# Patient Record
Sex: Female | Born: 1935 | Race: White | Hispanic: No | Smoking: Former smoker
Health system: Southern US, Community
[De-identification: ages and names within clinical notes are randomized; demographics above are authoritative.]

## PROBLEM LIST (undated history)

## (undated) HISTORY — PX: ABDOMINAL HYSTERECTOMY: SHX81

---

## 2009-02-08 ENCOUNTER — Emergency Department (HOSPITAL_COMMUNITY): Admission: EM | Admit: 2009-02-08 | Discharge: 2009-02-08 | Payer: Self-pay | Admitting: Emergency Medicine

## 2010-08-27 LAB — URINALYSIS, ROUTINE W REFLEX MICROSCOPIC
Bilirubin Urine: NEGATIVE
Ketones, ur: NEGATIVE mg/dL
Nitrite: NEGATIVE
Urobilinogen, UA: 0.2 mg/dL (ref 0.0–1.0)

## 2010-08-27 LAB — CBC
HCT: 38.1 % (ref 36.0–46.0)
Hemoglobin: 13 g/dL (ref 12.0–15.0)
MCV: 91.7 fL (ref 78.0–100.0)
Platelets: 269 10*3/uL (ref 150–400)
RDW: 13.5 % (ref 11.5–15.5)
WBC: 8 10*3/uL (ref 4.0–10.5)

## 2010-08-27 LAB — BASIC METABOLIC PANEL
BUN: 13 mg/dL (ref 6–23)
Chloride: 108 mEq/L (ref 96–112)
GFR calc non Af Amer: 44 mL/min — ABNORMAL LOW (ref >60)
Glucose, Bld: 113 mg/dL — ABNORMAL HIGH (ref 70–99)
Potassium: 3.8 mEq/L (ref 3.5–5.1)
Sodium: 142 mEq/L (ref 135–145)

## 2013-10-02 ENCOUNTER — Encounter (HOSPITAL_COMMUNITY): Payer: Self-pay | Admitting: Emergency Medicine

## 2013-10-02 ENCOUNTER — Emergency Department (HOSPITAL_COMMUNITY)
Admission: EM | Admit: 2013-10-02 | Discharge: 2013-10-02 | Disposition: A | Discharge status: Home / Self Care | Payer: Medicare HMO | Attending: Emergency Medicine | Admitting: Emergency Medicine

## 2013-10-02 DIAGNOSIS — I10 Essential (primary) hypertension: Secondary | ICD-10-CM

## 2013-10-02 DIAGNOSIS — Z87891 Personal history of nicotine dependence: Secondary | ICD-10-CM | POA: Insufficient documentation

## 2013-10-02 DIAGNOSIS — N3000 Acute cystitis without hematuria: Secondary | ICD-10-CM | POA: Insufficient documentation

## 2013-10-02 DIAGNOSIS — Z90710 Acquired absence of both cervix and uterus: Secondary | ICD-10-CM | POA: Insufficient documentation

## 2013-10-02 DIAGNOSIS — Z79899 Other long term (current) drug therapy: Secondary | ICD-10-CM | POA: Insufficient documentation

## 2013-10-02 DIAGNOSIS — Z792 Long term (current) use of antibiotics: Secondary | ICD-10-CM | POA: Insufficient documentation

## 2013-10-02 LAB — URINALYSIS, ROUTINE W REFLEX MICROSCOPIC
GLUCOSE, UA: 100 mg/dL — AB
Ketones, ur: NEGATIVE mg/dL
Nitrite: POSITIVE — AB
PROTEIN: 100 mg/dL — AB
Specific Gravity, Urine: 1.01 (ref 1.005–1.030)
UROBILINOGEN UA: 0.2 mg/dL (ref 0.0–1.0)
pH: 6.5 (ref 5.0–8.0)

## 2013-10-02 LAB — URINE MICROSCOPIC-ADD ON

## 2013-10-02 MED ORDER — PHENAZOPYRIDINE HCL 200 MG PO TABS: 200.0000 mg | ORAL_TABLET | Freq: Three times a day (TID) | ORAL | Status: DC

## 2013-10-02 MED ORDER — CEPHALEXIN 500 MG PO CAPS: 500.0000 mg | ORAL_CAPSULE | Freq: Four times a day (QID) | ORAL | Status: DC

## 2013-10-02 MED ORDER — CEPHALEXIN 500 MG PO CAPS
500.0000 mg | ORAL_CAPSULE | Freq: Once | ORAL | Status: AC
  Administered 2013-10-02: 500 mg via ORAL
  Filled 2013-10-02: qty 1

## 2013-10-02 MED ORDER — PHENAZOPYRIDINE HCL 100 MG PO TABS
200.0000 mg | ORAL_TABLET | Freq: Once | ORAL | Status: AC
  Administered 2013-10-02: 200 mg via ORAL
  Filled 2013-10-02: qty 2

## 2013-10-02 NOTE — ED Provider Notes (Signed)
CSN: [id number]     Arrival date & time 10/02/13  1853 History   First MD Initiated Contact with Patient 10/02/13 2038     Chief Complaint  Patient presents with  . Hematuria     (Consider location/radiation/quality/duration/timing/severity/associated sxs/prior Treatment) Patient is a 78 y.o. female presenting with hematuria.  Hematuria Pertinent negatives include no abdominal pain, arthralgias, chest pain, chills, congestion, fever, headaches, joint swelling, nausea, neck pain, numbness, rash, sore throat or weakness.     Donivan Scullmma F Morace is a 78 y.o. female presenting with mild dysuria with increased urinary frequency and hematuria which started around lunchtime today.  She reports having occasional episodes of UTIs, most recently about ago.  She denies fevers or chills has had no nausea or vomiting and denies abdominal pain and low back pain.  She does have suprapubic pressure sensation only which is mild.  She has taken no medications prior to arrival.    History reviewed. No pertinent past medical history. Past Surgical History  Procedure Laterality Date  . Abdominal hysterectomy     History reviewed. No pertinent family history. History  Substance Use Topics  . Smoking status: Former Games developermoker  . Smokeless tobacco: Not on file  . Alcohol Use: No   OB History   Grav Para Term Preterm Abortions TAB SAB Ect Mult Living                 Review of Systems  Constitutional: Negative for fever and chills.  HENT: Negative for congestion and sore throat.   Eyes: Negative.   Respiratory: Negative for chest tightness and shortness of breath.   Cardiovascular: Negative for chest pain.  Gastrointestinal: Negative for nausea and abdominal pain.  Genitourinary: Positive for dysuria and hematuria. Negative for vaginal bleeding and pelvic pain.  Musculoskeletal: Negative for arthralgias, joint swelling and neck pain.  Skin: Negative.  Negative for rash and wound.  Neurological: Negative  for dizziness, weakness, light-headedness, numbness and headaches.  Psychiatric/Behavioral: Negative.       Allergies  Review of patient's allergies indicates no known allergies.  Home Medications   Prior to Admission medications   Medication Sig Start Date End Date Taking? Authorizing Provider  cephALEXin (KEFLEX) 500 MG capsule Take 1 capsule (500 mg total) by mouth 4 (four) times daily. 10/02/13   Burgess AmorJulie Khyan Oats, PA-C  phenazopyridine (PYRIDIUM) 200 MG tablet Take 1 tablet (200 mg total) by mouth 3 (three) times daily. 10/02/13   Burgess AmorJulie Mystique Bjelland, PA-C   BP 170/92  Pulse 109  Temp(Src) 98 F (36.7 C) (Oral)  Resp 18  Ht 5\' 4"  (1.626 m)  Wt 134 lb 5 oz (60.924 kg)  BMI 23.04 kg/m2  SpO2 93% Physical Exam  Nursing note and vitals reviewed. Constitutional: She appears well-developed and well-nourished.  Hypertension  HENT:  Head: Normocephalic and atraumatic.  Eyes: Conjunctivae are normal.  Neck: Normal range of motion.  Cardiovascular: Normal rate, regular rhythm, normal heart sounds and intact distal pulses.   Pulmonary/Chest: Effort normal and breath sounds normal. She has no wheezes.  Abdominal: Soft. Bowel sounds are normal. There is no tenderness. There is no guarding and no CVA tenderness.  Musculoskeletal: Normal range of motion. She exhibits no tenderness.  Neurological: She is alert.  Skin: Skin is warm and dry.  Psychiatric: She has a normal mood and affect.    ED Course  Procedures (including critical care time) Labs Review Labs Reviewed  URINALYSIS, ROUTINE W REFLEX MICROSCOPIC - Abnormal; Notable for the following:  Color, Urine RED (*)    APPearance CLOUDY (*)    Glucose, UA 100 (*)    Hgb urine dipstick LARGE (*)    Bilirubin Urine SMALL (*)    Protein, ur 100 (*)    Nitrite POSITIVE (*)    Leukocytes, UA LARGE (*)    All other components within normal limits  URINE MICROSCOPIC-ADD ON - Abnormal; Notable for the following:    Squamous Epithelial / LPF  MANY (*)    Bacteria, UA MANY (*)    All other components within normal limits  URINE CULTURE    Imaging Review No results found.   EKG Interpretation None      MDM   Final diagnoses:  Cystitis, acute    Patient's labs were reviewed prior to discharge home.  She was also seen by Dr. Manus Gunningancour prior to discharge home.  She was prescribed Keflex) BM.  Also given information about hypertension and strongly encouraged followup with a primary doctor.  She states she is not seen a doctor in many years and doesn't want one.  She she was however encouraged to followup with someone for further evaluation of hypertension.  Also encouraged recheck for any worsened symptoms including fevers, vomiting, worse pain which can be sign of resistant or worsening infection.  Urine culture has been ordered.      Burgess AmorJulie Shateka Petrea, PA-C 10/02/13 2127

## 2013-10-02 NOTE — ED Notes (Signed)
Pt states she has been urinating blood since lunch time today. Denies any other pain.

## 2013-10-02 NOTE — Discharge Instructions (Signed)
Hypertension As your heart beats, it forces blood through your arteries. This force is your blood pressure. If the pressure is too high, it is called hypertension (HTN) or high blood pressure. HTN is dangerous because you may have it and not know it. High blood pressure may mean that your heart has to work harder to pump blood. Your arteries may be narrow or stiff. The extra work puts you at risk for heart disease, stroke, and other problems.  Blood pressure consists of two numbers, a higher number over a lower, 110/72, for example. It is stated as "110 over 72." The ideal is below 120 for the top number (systolic) and under 80 for the bottom (diastolic). Write down your blood pressure today. You should pay close attention to your blood pressure if you have certain conditions such as:  Heart failure.  Prior heart attack.  Diabetes  Chronic kidney disease.  Prior stroke.  Multiple risk factors for heart disease. To see if you have HTN, your blood pressure should be measured while you are seated with your arm held at the level of the heart. It should be measured at least twice. A one-time elevated blood pressure reading (especially in the Emergency Department) does not mean that you need treatment. There may be conditions in which the blood pressure is different between your right and left arms. It is important to see your caregiver soon for a recheck. Most people have essential hypertension which means that there is not a specific cause. This type of high blood pressure may be lowered by changing lifestyle factors such as:  Stress.  Smoking.  Lack of exercise.  Excessive weight.  Drug/tobacco/alcohol use.  Eating less salt. Most people do not have symptoms from high blood pressure until it has caused damage to the body. Effective treatment can often prevent, delay or reduce that damage. TREATMENT  When a cause has been identified, treatment for high blood pressure is directed at the  cause. There are a large number of medications to treat HTN. These fall into several categories, and your caregiver will help you select the medicines that are best for you. Medications may have side effects. You should review side effects with your caregiver. If your blood pressure stays high after you have made lifestyle changes or started on medicines,   Your medication(s) may need to be changed.  Other problems may need to be addressed.  Be certain you understand your prescriptions, and know how and when to take your medicine.  Be sure to follow up with your caregiver within the time frame advised (usually within two weeks) to have your blood pressure rechecked and to review your medications.  If you are taking more than one medicine to lower your blood pressure, make sure you know how and at what times they should be taken. Taking two medicines at the same time can result in blood pressure that is too low. SEEK IMMEDIATE MEDICAL CARE IF:  You develop a severe headache, blurred or changing vision, or confusion.  You have unusual weakness or numbness, or a faint feeling.  You have severe chest or abdominal pain, vomiting, or breathing problems. MAKE SURE YOU:   Understand these instructions.  Will watch your condition.  Will get help right away if you are not doing well or get worse. Document Released: 05/09/2005 Document Revised: 08/01/2011 Document Reviewed: 12/28/2007 [facility] Patient Information 2014 [place], [place]. Urinary Tract Infection A urinary tract infection (UTI) can occur any place along the urinary tract.  The tract includes the kidneys, ureters, bladder, and urethra. A type of germ called bacteria often causes a UTI. UTIs are often helped with antibiotic medicine.  HOME CARE   If given, take antibiotics as told by your doctor. Finish them even if you start to feel better.  Drink enough fluids to keep your pee (urine) clear or pale yellow.  Avoid tea, drinks with  caffeine, and bubbly (carbonated) drinks.  Pee often. Avoid holding your pee in for a long time.  Pee before and after having sex (intercourse).  Wipe from front to back after you poop (bowel movement) if you are a woman. Use each tissue only once. GET HELP RIGHT AWAY IF:   You have back pain.  You have lower belly (abdominal) pain.  You have chills.  You feel sick to your stomach (nauseous).  You throw up (vomit).  Your burning or discomfort with peeing does not go away.  You have a fever.  Your symptoms are not better in 3 days. MAKE SURE YOU:   Understand these instructions.  Will watch your condition.  Will get help right away if you are not doing well or get worse. Document Released: 10/26/2007 Document Revised: 02/01/2012 Document Reviewed: 12/08/2011 [facility] Patient Information 2014 [place], [place].   Emergency Department Resource Guide 1) Find a Doctor and Pay Out of Pocket Although you won't have to find out who is covered by your insurance plan, it is a good idea to ask around and get recommendations. You will then need to call the office and see if the doctor you have chosen will accept you as a new patient and what types of options they offer for patients who are self-pay. Some doctors offer discounts or will set up payment plans for their patients who do not have insurance, but you will need to ask so you aren't surprised when you get to your appointment.  2) Contact Your Local Health Department Not all health departments have doctors that can see patients for sick visits, but many do, so it is worth a call to see if yours does. If you don't know where your local health department is, you can check in your phone book. The CDC also has a tool to help you locate your state's health department, and many state websites also have listings of all of their local health departments.  3) Find a Walk-in Clinic If your illness is not likely to be very severe or  complicated, you may want to try a walk in clinic. These are popping up all over the country in pharmacies, drugstores, and shopping centers. They're usually staffed by nurse practitioners or physician assistants that have been trained to treat common illnesses and complaints. They're usually fairly quick and inexpensive. However, if you have serious medical issues or chronic medical problems, these are probably not your best option.  No Primary Care Doctor: - Call Health Connect at  [phone] - they can help you locate a primary care doctor that  accepts your insurance, provides certain services, etc. - Physician Referral Service- [phone]  Chronic Pain Problems: Organization         Address  Phone   Notes  [facility]  [phone] Patients need to be referred by their primary care doctor.   Medication Assistance: Organization         Address  Phone   Notes  [facility] Medication [facility] [address] [place] [place], [place] [address] [phone] --  Must be a resident of [place] [facility] [place] -- Must have NO insurance coverage whatsoever (no Medicaid/ Medicare, etc.) -- The pt. MUST have a primary care doctor that directs their care regularly and follows them in the community   MedAssist  [phone]) [phone]   [employer]  [phone]) [phone]    Agencies that provide inexpensive medical care: Organization         Address  Phone   Notes  [facility]  [phone]) [phone]   [facility]    [phone]   [facility] [address] [place], [place] [id number] [phone]) [phone]   [facility] [address], [place] [phone]) [phone]   [facility]anned Parenthood    [phone]) [phone]   [facility]    [phone]   [facility]  [address], [place] Phone:  [phone], Fax:  [phone] Hours of Operation:  9 am - 6 pm, M-F.  Also accepts  Medicaid/Medicare and self-pay.  [facility]  [address], [place] Phone: [phone]) [phone], Fax: [phone]) [phone]. Hours of Operation:  8:30 am - 5:30 pm, M-F.  Also accepts Medicaid and self-pay.  [facility] [place] [address], [place] Phone: [phone]) [phone]   [facility] [address], [place], [place] [phone], Ext. [phone] Mondays & Thursdays: 7-9 AM.  First 15 patients are seen on a first come, first serve basis.    Medicaid-accepting [facility] [place] Providers:  Organization         Address  Phone   Notes  [facility] [address], [place] [phone]) [phone] Also accepts self-pay patients.  [facility] [address] [place], [place]  [phone]   [facility] [address], [place] [phone]) [phone]   [facility] [address] [place] [address] [place] [address], [place] [phone]) [phone]   Renaye RakersVeita Bland [address], [place]   [phone] Only accepts [place] Access [place] patients after they have their name applied to their card.   Self-Pay (no insurance) in [facility] [place]:  Organization         Address  Phone   Notes  Sickle Cell Patients, [facility] [address], [place] [phone]) [phone]   [facility] [address], [place] [phone]) [phone]   [facility] [place]  [address], [place] [phone]   [facility]/Dr. Osei-Bonsu  [address] [place] [address], [place] or [phone], [place] [phone] Phone number for both [place] and [place] locations is the same.  [facility] [address], [place] [phone]) [phone]   [facility] [address] [place] [address], [place] or [address] [phone]) [phone]   [facility] [address], [place] [phone](336)  [phone], phone; [phone]) [phone], fax Sees patients 1st and 3rd Saturday of every month.  Must not qualify for public or private insurance (i.e. Medicaid, Medicare, [place] Health Choice, Veterans' Benefits)  Household income should be no more than 200% of the poverty level The clinic cannot treat you if you are pregnant or think you are pregnant  Sexually transmitted diseases are not treated at the clinic.    Dental Care: Organization  Address  Phone  Notes  °[place] [facility] [address], [place] [phone] Accepts children up to age 21 who are enrolled in Medicaid or [place] Health Choice; pregnant women with a Medicaid card; and children who have applied for Medicaid or [place] Health Choice, but were declined, whose parents can pay a reduced fee at time of service.  °[place] [facility] [place]  [address], [place] [phone] Accepts children up to age 21 who are enrolled in Medicaid or [place] Health Choice; pregnant women with a Medicaid card; and children who have applied for Medicaid or [place] Health Choice, but were declined, whose parents can pay a reduced fee at time of service.  °[facility] ° [address], [place] [phone] Patients are seen by appointment only. Walk-ins are not accepted. [facility] will see patients 18 years of age and older. °Monday - Tuesday (8am-5pm) °Most Wednesdays (8:30-5pm) °$30 per visit, cash only  °[facility] ° [address], [place] [phone] Patients are seen by appointment only. Walk-ins are not accepted. [facility] will see patients 18 years of age and older. °One Wednesday Evening (Monthly: Volunteer Based).  $30 per visit, cash only  °[facility]  [phone] for adults; Children under age 4, call Graduate Pediatric Dentistry at [phone]. Children aged  4-14, please call [phone] to request a pediatric application. ° Dental services are provided in all areas of dental care including fillings, crowns and bridges, complete and partial dentures, implants, gum treatment, root canals, and extractions. Preventive care is also provided. Treatment is provided to both adults and children. °Patients are selected via a lottery and there is often a waiting list. °  °[facility] [address], °[place] ° [phone] [email or web address] °  °[facility] [address], [place], [place] [phone], Ext. [phone] Second and Fourth Thursday of each month, opens at 6:30 AM; Clinic ends at 9 AM.  Patients are seen on a first-come first-served basis, and a limited number are seen during each clinic.  ° °[facility] ° [address], [place], [place] [phone]   Eligibility Requirements °You must have lived in [place], [place], or [place] counties for at least the last three months. °  You cannot be eligible for state or federal sponsored healthcare insurance, including Veterans Administration, Medicaid, or Medicare. °  You generally cannot be eligible for healthcare insurance through your employer.  °  How to apply: °Eligibility screenings are held every Tuesday and Wednesday afternoon from 1:00 pm until 4:00 pm. You do not need an appointment for the interview!  °[facility] [address], [place], [place] [phone]   °[facility]  [phone]   °[place]  [phone]   °[facility]  [phone]   ° °Behavioral Health Resources in the Community: °Intensive Outpatient Programs °Organization         Address  Phone  Notes  °[place] [facility] [address], [place], [place] [phone]   °[facility] [address], [place], [place] [phone]   °[facility] [address],  [place], [place] ° [phone]   °[facility] [address],  °[place], [place] 1-800-853-5163 or 336-641-4981   °Substance Abuse Resources °  Organization         Address  Phone  Notes  [facility]  (514)[phone]   [facility]  [phone]   [facility]  [phone]   Floydene Flock  [phone]   Residential & Outpatient Substance Abuse Program  [phone]   Psychological Services Organization         Address  Phone  Notes  [facility]  [phone]   [facility] Services  [phone]   [facility] [address] [place] (970)[phone] or [phone]    Mobile Crisis Teams Organization         Address  Phone  Notes  [facility], Mobile Crisis Care Unit  6060229597   [facility]  [address] [place], [place] [phone]   Doristine Locks [address] [place] [phone]    Self-Help/Support Groups Organization         Address  Phone             Notes  [facility] [place] - variety of support groups  336- I7437963 Call for more information  Narcotics Anonymous (NA), [facility] [address], [employer] [place]  2 meetings at this location   Statistician         Address  Phone  Notes  [facility] [address],    [place]  [phone]   [facility]  [address] [place] [address], [place], [place] [phone]   [facility] [address] [place], [place] [phone] Admissions: 8am-3pm M-F  [facility] [address]    [place], [place] [phone]   [facility] [address] [place], [place], [place] [phone]   [facility] [address]  [place], [place] [phone]   [facility] Intensive Outpatient [address], [place], [place] [phone]   [facility]  ([facility] [address] [place].,  [place], [place] [phone] or [phone]   [facility] ([facility]) [address], [place], [place] [phone] Accepts Medicaid  [facility] [address]  [place] [phone] Substance Abuse/Addiction Treatment   [facility] Organization         Address  Phone  Notes  [facility]  3187957984   Angie Fava, PhD [address] [place], [place]   [phone] or [phone]   [facility]   [address] [place], [place] [phone]   [facility] [address] [place], [place] [phone] Insurance/Medicaid/sponsorship through [facility] [address]                                    [place], [place] [phone] Therapy/tele-psych/case  [facility] [address], [place] [phone]    Dr. Lolly Mustache  [phone]   [facility]  [employer] [facility] Dept. 1) [address], [place] 2) [address] [place] 3)  [address], [place] [phone]  [phone]   [facility] Child Abuse Hotline 502-337-9743 or 956-821-9729 (After Hours)

## 2013-10-02 NOTE — ED Provider Notes (Signed)
Medical screening examination/treatment/procedure(s) were conducted as a shared visit with non-physician practitioner(s) and myself.  I personally evaluated the patient during the encounter.  1 day of hematuria and suprapubic pressure.  No abdominal pain, vomiting, fever, back pain. No distress. No CVAT, minimal suprapubic tenderness   EKG Interpretation None       Glynn OctaveStephen Donielle Kaigler, MD 10/02/13 [phone]

## 2013-10-04 LAB — URINE CULTURE
Colony Count: 9000
SPECIAL REQUESTS: NORMAL

## 2014-03-07 ENCOUNTER — Inpatient Hospital Stay (HOSPITAL_COMMUNITY): Payer: Medicare HMO

## 2014-03-07 ENCOUNTER — Emergency Department (HOSPITAL_COMMUNITY): Payer: Medicare HMO

## 2014-03-07 ENCOUNTER — Inpatient Hospital Stay (HOSPITAL_COMMUNITY)
Admission: EM | Admit: 2014-03-07 | Discharge: 2014-03-11 | DRG: 064 | Disposition: A | Discharge status: Hospice | Payer: Medicare HMO | Attending: Neurology | Admitting: Neurology

## 2014-03-07 DIAGNOSIS — I6389 Other cerebral infarction: Secondary | ICD-10-CM

## 2014-03-07 DIAGNOSIS — I619 Nontraumatic intracerebral hemorrhage, unspecified: Secondary | ICD-10-CM | POA: Diagnosis present

## 2014-03-07 DIAGNOSIS — J810 Acute pulmonary edema: Secondary | ICD-10-CM

## 2014-03-07 DIAGNOSIS — I10 Essential (primary) hypertension: Secondary | ICD-10-CM | POA: Diagnosis present

## 2014-03-07 DIAGNOSIS — I63519 Cerebral infarction due to unspecified occlusion or stenosis of unspecified middle cerebral artery: Secondary | ICD-10-CM | POA: Diagnosis present

## 2014-03-07 DIAGNOSIS — G936 Cerebral edema: Secondary | ICD-10-CM | POA: Diagnosis present

## 2014-03-07 DIAGNOSIS — I63412 Cerebral infarction due to embolism of left middle cerebral artery: Secondary | ICD-10-CM

## 2014-03-07 DIAGNOSIS — R0902 Hypoxemia: Secondary | ICD-10-CM

## 2014-03-07 DIAGNOSIS — Z66 Do not resuscitate: Secondary | ICD-10-CM | POA: Diagnosis present

## 2014-03-07 DIAGNOSIS — R0600 Dyspnea, unspecified: Secondary | ICD-10-CM

## 2014-03-07 DIAGNOSIS — Z09 Encounter for follow-up examination after completed treatment for conditions other than malignant neoplasm: Secondary | ICD-10-CM

## 2014-03-07 DIAGNOSIS — I63432 Cerebral infarction due to embolism of left posterior cerebral artery: Secondary | ICD-10-CM

## 2014-03-07 DIAGNOSIS — I4891 Unspecified atrial fibrillation: Secondary | ICD-10-CM | POA: Diagnosis present

## 2014-03-07 DIAGNOSIS — Z515 Encounter for palliative care: Secondary | ICD-10-CM | POA: Diagnosis not present

## 2014-03-07 DIAGNOSIS — I611 Nontraumatic intracerebral hemorrhage in hemisphere, cortical: Secondary | ICD-10-CM | POA: Diagnosis present

## 2014-03-07 DIAGNOSIS — K117 Disturbances of salivary secretion: Secondary | ICD-10-CM

## 2014-03-07 DIAGNOSIS — I639 Cerebral infarction, unspecified: Secondary | ICD-10-CM

## 2014-03-07 DIAGNOSIS — E785 Hyperlipidemia, unspecified: Secondary | ICD-10-CM | POA: Diagnosis present

## 2014-03-07 DIAGNOSIS — R0689 Other abnormalities of breathing: Secondary | ICD-10-CM

## 2014-03-07 DIAGNOSIS — T17908A Unspecified foreign body in respiratory tract, part unspecified causing other injury, initial encounter: Secondary | ICD-10-CM

## 2014-03-07 LAB — CBC WITH DIFFERENTIAL/PLATELET
Basophils Absolute: 0 10*3/uL (ref 0.0–0.1)
Basophils Relative: 1 % (ref 0–1)
Eosinophils Absolute: 0 10*3/uL (ref 0.0–0.7)
Eosinophils Relative: 1 % (ref 0–5)
HCT: 41.6 % (ref 36.0–46.0)
Hemoglobin: 13.8 g/dL (ref 12.0–15.0)
LYMPHS ABS: 1.5 10*3/uL (ref 0.7–4.0)
LYMPHS PCT: 18 % (ref 12–46)
MCH: 30 pg (ref 26.0–34.0)
MCHC: 33.2 g/dL (ref 30.0–36.0)
MCV: 90.4 fL (ref 78.0–100.0)
Monocytes Absolute: 0.4 10*3/uL (ref 0.1–1.0)
Monocytes Relative: 5 % (ref 3–12)
NEUTROS ABS: 6.5 10*3/uL (ref 1.7–7.7)
NEUTROS PCT: 75 % (ref 43–77)
PLATELETS: 212 10*3/uL (ref 150–400)
RBC: 4.6 MIL/uL (ref 3.87–5.11)
RDW: 13.5 % (ref 11.5–15.5)
WBC: 8.5 10*3/uL (ref 4.0–10.5)

## 2014-03-07 LAB — BASIC METABOLIC PANEL
Anion gap: 16 — ABNORMAL HIGH (ref 5–15)
BUN: 15 mg/dL (ref 6–23)
CO2: 20 meq/L (ref 19–32)
Calcium: 9.7 mg/dL (ref 8.4–10.5)
Chloride: 100 mEq/L (ref 96–112)
Creatinine, Ser: 1.08 mg/dL (ref 0.50–1.10)
GFR calc Af Amer: 56 mL/min — ABNORMAL LOW (ref >90)
GFR calc non Af Amer: 49 mL/min — ABNORMAL LOW (ref >90)
GLUCOSE: 124 mg/dL — AB (ref 70–99)
POTASSIUM: 4.3 meq/L (ref 3.7–5.3)
SODIUM: 136 meq/L — AB (ref 137–147)

## 2014-03-07 LAB — URINALYSIS, ROUTINE W REFLEX MICROSCOPIC
Bilirubin Urine: NEGATIVE
Glucose, UA: NEGATIVE mg/dL
Leukocytes, UA: NEGATIVE
Nitrite: NEGATIVE
PROTEIN: NEGATIVE mg/dL
Specific Gravity, Urine: 1.01 (ref 1.005–1.030)
UROBILINOGEN UA: 0.2 mg/dL (ref 0.0–1.0)
pH: 7.5 (ref 5.0–8.0)

## 2014-03-07 LAB — PROTIME-INR
INR: 1.12 (ref 0.00–1.49)
PROTHROMBIN TIME: 14.5 s (ref 11.6–15.2)

## 2014-03-07 LAB — MRSA PCR SCREENING: MRSA by PCR: POSITIVE — AB

## 2014-03-07 LAB — URINE MICROSCOPIC-ADD ON

## 2014-03-07 LAB — TROPONIN I: Troponin I: <0.3 ng/mL (ref <0.30)

## 2014-03-07 MED ORDER — INFLUENZA VAC SPLIT QUAD 0.5 ML IM SUSY
0.5000 mL | PREFILLED_SYRINGE | INTRAMUSCULAR | Status: DC
  Filled 2014-03-07: qty 0.5

## 2014-03-07 MED ORDER — LABETALOL HCL 5 MG/ML IV SOLN
10.0000 mg | INTRAVENOUS | Status: DC | PRN
  Administered 2014-03-07: 10 mg via INTRAVENOUS
  Filled 2014-03-07: qty 4

## 2014-03-07 MED ORDER — PANTOPRAZOLE SODIUM 40 MG IV SOLR
40.0000 mg | Freq: Every day | INTRAVENOUS | Status: DC
  Administered 2014-03-07 – 2014-03-08 (×2): 40 mg via INTRAVENOUS
  Filled 2014-03-07 (×3): qty 40

## 2014-03-07 MED ORDER — SODIUM CHLORIDE 0.9 % IV SOLN
INTRAVENOUS | Status: DC
  Administered 2014-03-07 – 2014-03-08 (×3): via INTRAVENOUS

## 2014-03-07 MED ORDER — CHLORHEXIDINE GLUCONATE CLOTH 2 % EX PADS
6.0000 | MEDICATED_PAD | Freq: Every day | CUTANEOUS | Status: DC
  Administered 2014-03-08 – 2014-03-11 (×3): 6 via TOPICAL

## 2014-03-07 MED ORDER — ACETAMINOPHEN 650 MG RE SUPP: 650.0000 mg | RECTAL | Status: DC | PRN

## 2014-03-07 MED ORDER — STROKE: EARLY STAGES OF RECOVERY BOOK
Freq: Once | Status: DC
  Filled 2014-03-07: qty 1

## 2014-03-07 MED ORDER — ACETAMINOPHEN 325 MG PO TABS: 650.0000 mg | ORAL_TABLET | ORAL | Status: DC | PRN

## 2014-03-07 MED ORDER — MUPIROCIN 2 % EX OINT
1.0000 "application " | TOPICAL_OINTMENT | Freq: Two times a day (BID) | CUTANEOUS | Status: DC
  Administered 2014-03-07 – 2014-03-08 (×3): 1 via NASAL
  Filled 2014-03-07 (×2): qty 22

## 2014-03-07 MED ORDER — PNEUMOCOCCAL VAC POLYVALENT 25 MCG/0.5ML IJ INJ
0.5000 mL | INJECTION | INTRAMUSCULAR | Status: DC
  Filled 2014-03-07: qty 0.5

## 2014-03-07 MED ORDER — SENNOSIDES-DOCUSATE SODIUM 8.6-50 MG PO TABS
1.0000 | ORAL_TABLET | Freq: Two times a day (BID) | ORAL | Status: DC
  Filled 2014-03-07 (×5): qty 1

## 2014-03-07 NOTE — H&P (Addendum)
H&P    Chief Complaint: ICH  HPI:                                                                                                                                         Majority of history obtained from chart   Valerie Browning is an 78 y.o. female who was brought [facility] after family found patient in AM not acting herself.  Patient was seen last night around 2000 hours, and apparently she was awake and walking and talking and at baseline.. When they went to check on her this morning, she was sitting apparently at home. She had a bruise on her right forehead was unable to speak with them. He called 911. She was brought to the hospital. She presents here 14 hours after her last time known normal. CT head showed a right ICH.  Patient was brought to [facility] and per report found to be in Afib with RVR.  She was given Cardizem and rates have been in the 80's.  patient was then [facility] to [facility].  Currently she is non verbal, follows no commands and is moving left>right. BP 158/98.    Date last known well: Date: 03/06/2014 Time last known well: Time: 20:00 tPA Given: No: ICH  No past medical history on file.  Past Surgical History  Procedure Laterality Date  . Abdominal hysterectomy      No family history on file and unable to obtain due to patients mental status.   Social History:  reports that she has quit smoking. She does not have any smokeless tobacco history on file. She reports that she does not drink alcohol or use illicit drugs.  Allergies: No Known Allergies  Medications:                                                                                                                           Current Facility-Administered Medications  Medication Dose Route Frequency Provider Last Rate Last Dose  .  stroke: mapping our early stages of recovery book   Does not apply Once Ulice Dashavid R Smith, PA-C      . 0.9 %  sodium chloride infusion   Intravenous Continuous Ulice Dashavid  R Smith, PA-C      . acetaminophen (TYLENOL) tablet 650 mg  650 mg Oral Q4H PRN Ulice Dashavid R Smith, PA-C  Or  . acetaminophen (TYLENOL) suppository 650 mg  650 mg Rectal Q4H PRN Ulice Dashavid R Smith, PA-C      . labetalol (NORMODYNE,TRANDATE) injection 10-40 mg  10-40 mg Intravenous Q10 min PRN Ulice Dashavid R Smith, PA-C      . pantoprazole (PROTONIX) injection 40 mg  40 mg Intravenous QHS Ulice Dashavid R Smith, PA-C      . senna-docusate (Senokot-S) tablet 1 tablet  1 tablet Oral BID Ulice Dashavid R Smith, PA-C       Prior to hospitalization: Keflex Pyridium  ROS:                                                                                                                                       History obtained from unobtainable from patient due to mental status   Neurologic Examination:                                                                                                      Blood pressure 148/82, pulse 84, temperature 98.7 F (37.1 C), temperature source Oral, resp. rate 16, height 5\' 3"  (1.6 m), weight 61 kg (134 lb 7.7 oz), SpO2 100.00%.  General: NAD CV: irregular Abd: NT,ND Resp: rhonchi bialterally.  Ext: no edema.  Mental Status: Alert,non verbal and follows no verbal or visual commands. Localizes to pain with the left hand. Moves left side purposefully.   Cranial Nerves: II: Discs flat bilaterally; right hemianopsia, pupils equal, round, reactive to light and accommodation III,IV, VI: ptosis not present, has a left gaze deviation but does cross midline with oculocephalic maneuver.  V,VII: right facial droop, winces to pain bilaterally VIII: unable to assess IX,X: gag reflex present XI: bilateral shoulder shrug XII: unable to assess  Motor: --moves all extremities antigravity but the right arm and leg are weaker. Localizes to pain with the left hand.  Tone and bulk: increased tone on right arm.  no atrophy noted Sensory: withdraws from pain in all extremities.  Deep Tendon Reflexes:   Right: Upper Extremity   Left: Upper extremity   biceps (C-5 to C-6) 2/4   biceps (C-5 to C-6) 2/4 tricep (C7) 2/4    triceps (C7) 2/4 Brachioradialis (C6) 2/4  Brachioradialis (C6) 2/4  Lower Extremity Lower Extremity  quadriceps (L-2 to L-4) 2/4   quadriceps (L-2 to L-4) 2/4 Achilles (S1) 2/4   Achilles (S1) 2/4  Plantars: Right: downgoing   Left: downgoing Cerebellar: Unable to assess Gait: unable to assess CV: pulses palpable throughout  Lab Results: Basic Metabolic Panel:  Recent Labs Lab 03/07/14 1046  NA 136*  K 4.3  CL 100  CO2 20  GLUCOSE 124*  BUN 15  CREATININE 1.08  CALCIUM 9.7    Liver Function Tests: No results found for this basename: AST, ALT, ALKPHOS, BILITOT, PROT, ALBUMIN,  in the last 168 hours No results found for this basename: LIPASE, AMYLASE,  in the last 168 hours No results found for this basename: AMMONIA,  in the last 168 hours  CBC:  Recent Labs Lab 03/07/14 1046  WBC 8.5  NEUTROABS 6.5  HGB 13.8  HCT 41.6  MCV 90.4  PLT 212    Cardiac Enzymes:  Recent Labs Lab 03/07/14 1046  TROPONINI <0.30    Lipid Panel: No results found for this basename: CHOL, TRIG, HDL, CHOLHDL, VLDL, LDLCALC,  in the last 168 hours  CBG: No results found for this basename: GLUCAP,  in the last 168 hours  Microbiology: Results for orders placed during the hospital encounter of 10/02/13  URINE CULTURE     Status: None   Collection Time    10/02/13  9:00 PM      Result Value Ref Range Status   Specimen Description URINE, CLEAN CATCH   Final   Special Requests Normal   Final   Culture  Setup Time     Final   Value: 10/02/2013 20:25     Performed at [facility] Count     Final   Value: 9,000 COLONIES/ML     Performed at [facility]   Culture     Final   Value: INSIGNIFICANT GROWTH     Performed at [facility]   Report Status 10/04/2013 FINAL   Final    Coagulation Studies:  Recent Labs   03/07/14 1046  LABPROT 14.5  INR 1.12    Imaging: Ct Head Wo Contrast  03/07/2014   CLINICAL DATA:  Unable to arouse.  Atrial fibrillation.  EXAM: CT HEAD WITHOUT CONTRAST  TECHNIQUE: Contiguous axial images were obtained from the base of the skull through the vertex without intravenous contrast.  COMPARISON:  None.  FINDINGS: A large 3.6 x 2.8 cm intraparenchymal hemorrhage left frontoparietal region is present. Approximately 4 mm midline shift from left-to-right is present. Severe cortical white matter edema noted throughout the entire left occipital lobe consistent with left occipital infarction. Old lacunar infarct left caudate head. White matter changes consistent with chronic ischemia. No hydrocephalus. No acute bony abnormality. Visualized paranasal sinuses and mastoids are clear.  IMPRESSION: 1. Large 3.6 x 2.8 cm intraparenchymal hemorrhage left frontoparietal region with approximate midline shift of 4 mm from left to right . 2. Large left occipital lobe infarct. Critical Value/emergent results were called by telephone at the time of interpretation on 03/07/2014 at 11:36 am to Dr. Rolland Porter , who verbally acknowledged these results.   Electronically Signed   By: Maisie Fus  Register   On: 03/07/2014 11:39       Assessment and plan discussed with with attending physician and they are in agreement.    Felicie Morn PA-C [facility] [phone]  03/07/2014, 3:59 PM   Assessment: 78 y.o. female with likely ischemic infarct with hemorrhagic conversion. I have spoken with the family and they wish her to be DNR, but support her short of that. She has a fairly large infarct, and will need to repeat scan in the AM. If any worsening of edema would consider 3% NS.  If there is any worsening overnight, would get stat head CT and start 3%.   1. HgbA1c, fasting lipid panel 2. Repeat CT in the AM.  3. Frequent neuro checks 4. Echocardiogram 5. Carotid dopplers 6. Prophylactic therapy-  none given hemorrhagic conversion.  7. Risk factor modification 8. Telemetry monitoring 9. PT consult, OT consult, Speech consult 10. SBP goal 120 - 160  This patient is critically ill and at significant risk of neurological worsening, death and care requires constant monitoring of vital signs, hemodynamics,respiratory and cardiac monitoring, neurological assessment, discussion with family, other specialists and medical decision making of high complexity. I spent 60 minutes of neurocritical care time  in the care of  this patient.  Ritta Slot, MD [facility] [phone]  If 7pm- 7am, please page neurology on call as listed in AMION. 03/07/2014  6:53 PM

## 2014-03-07 NOTE — ED Notes (Signed)
Dr. Fayrene FearingJames in room to assess patient.

## 2014-03-07 NOTE — ED Notes (Signed)
Family conference w/Dr. Fayrene FearingJames.

## 2014-03-07 NOTE — ED Provider Notes (Addendum)
CSN: [id number]     Arrival date & time    History   This chart was scribed for Valerie SlotMcNeill Kirkpatrick, MD by Chestine SporeSoijett Blue, ED Scribe. The patient was seen in room APOTF/OTF at 10:37 AM.      Chief Complaint  Patient presents with  . Altered Mental Status    HPI Valerie Browning is a 78 y.o. female   LEVEL 5 CAVEAT: AMS Pt Last time known normal was 8 PM yesterday.   Presents via EMS. Family saw the patient last night, and apparently she was awake and walking and talking and at baseline.. When they went to check on her this morning, she was sitting apparently at home. She had a bruise on her right forehead was unable to speak with them.  He called 911. She was brought to the hospital. She presents here 14 hours after her last time known normal.  Noted to be in atrial fibrillation by EMS.. Given Cardizem by paramedics, 10 mg for RVR120.  No known history of A. fib. No current medications. Per family has not seen a physician" and about 10 years".    No past medical history on file. Past Surgical History  Procedure Laterality Date  . Abdominal hysterectomy     No family history on file. History  Substance Use Topics  . Smoking status: Former Games developermoker  . Smokeless tobacco: Not on file  . Alcohol Use: No   OB History   Grav Para Term Preterm Abortions TAB SAB Ect Mult Living                 Review of Systems  Unable to perform ROS: Mental status change      Allergies  Review of patient's allergies indicates no known allergies.  Home Medications   Prior to Admission medications   Medication Sig Start Date End Date Taking? Authorizing Provider  cephALEXin (KEFLEX) 500 MG capsule Take 1 capsule (500 mg total) by mouth 4 (four) times daily. 10/02/13   Burgess AmorJulie Idol, PA-C  phenazopyridine (PYRIDIUM) 200 MG tablet Take 1 tablet (200 mg total) by mouth 3 (three) times daily. 10/02/13   Burgess AmorJulie Idol, PA-C   BP 123/76  Pulse 77  Temp(Src) 98.7 F (37.1 C) (Oral)  Resp 16  SpO2  95% Physical Exam  Nursing note and vitals reviewed. Constitutional: She is oriented to person, place, and time. She appears well-developed and well-nourished. No distress.  nonverbal  HENT:  Head: Normocephalic.  Eyes: Conjunctivae are normal. Pupils are equal, round, and reactive to light. No scleral icterus.  Pupils are 3 mm symmetric and reactive.   Neck: Normal range of motion. Neck supple. No thyromegaly present.  Cardiovascular: Normal rate and regular rhythm.  Exam reveals no gallop and no friction rub.   No murmur heard. Pulmonary/Chest: Effort normal and breath sounds normal. No respiratory distress. She has no wheezes. She has no rales.  Abdominal: Soft. Bowel sounds are normal. She exhibits no distension. There is no tenderness. There is no rebound.  Musculoskeletal: Normal range of motion.  Flaccid right upper extremity. Spontaneous movement of upper extremity. Will withdraw the left and the right LE.   Neurological: She is alert and oriented to person, place, and time.   GCS 4 +1 +6=11 Preferred leftward gaze. Pupils 3 mm reactive. Flaccid right upper extremity. Is able to hold her left arm up in the air and the left leg off of the bed.  Aphasic.   Skin: Skin is warm and dry.  No rash noted.  Contusion to the right supraorbital ridge.   Psychiatric: She has a normal mood and affect. Her behavior is normal.    ED Course  Procedures (including critical care time) DIAGNOSTIC STUDIES: Oxygen Saturation is 95% on room air, normal by my interpretation.    COORDINATION OF CARE: 10:44 AM-Discussed treatment plan with pt at bedside and pt agreed to plan.   Labs Review Labs Reviewed  BASIC METABOLIC PANEL - Abnormal; Notable for the following:    Sodium 136 (*)    Glucose, Bld 124 (*)    GFR calc non Af Amer 49 (*)    GFR calc Af Amer 56 (*)    Anion gap 16 (*)    All other components within normal limits  URINALYSIS, ROUTINE W REFLEX MICROSCOPIC - Abnormal; Notable for the  following:    Hgb urine dipstick TRACE (*)    Ketones, ur TRACE (*)    All other components within normal limits  CBC WITH DIFFERENTIAL  PROTIME-INR  TROPONIN I  URINE MICROSCOPIC-ADD ON  URINE RAPID DRUG SCREEN (HOSP PERFORMED)    Imaging Review Ct Head Wo Contrast  03/07/2014   CLINICAL DATA:  Unable to arouse.  Atrial fibrillation.  EXAM: CT HEAD WITHOUT CONTRAST  TECHNIQUE: Contiguous axial images were obtained from the base of the skull through the vertex without intravenous contrast.  COMPARISON:  None.  FINDINGS: A large 3.6 x 2.8 cm intraparenchymal hemorrhage left frontoparietal region is present. Approximately 4 mm midline shift from left-to-right is present. Severe cortical white matter edema noted throughout the entire left occipital lobe consistent with left occipital infarction. Old lacunar infarct left caudate head. White matter changes consistent with chronic ischemia. No hydrocephalus. No acute bony abnormality. Visualized paranasal sinuses and mastoids are clear.  IMPRESSION: 1. Large 3.6 x 2.8 cm intraparenchymal hemorrhage left frontoparietal region with approximate midline shift of 4 mm from left to right . 2. Large left occipital lobe infarct. Critical Value/emergent results were called by telephone at the time of interpretation on 03/07/2014 at 11:36 am to Dr. Rolland PorterMARK Jovee Dettinger , who verbally acknowledged these results.   Electronically Signed   By: Maisie Fushomas  Register   On: 03/07/2014 11:39     EKG Interpretation None      MDM   Final diagnoses:  Cerebral infarction due to unspecified mechanism   CT shows left parietal hemorrhage 2.9 x 3.5 cm. Probable large left occipital infarct as well.  Patient is not hypotensive her pressure is 109. Atrial fibrillation on the monitor. Not showing signs of bradycardia.  I discussed the case with Dr. Ritta SlotMcNeill Browning of Neurology. He will admit the patient to his service and [facility].   I personally performed the services described in this documentation, which was scribed in my presence. The recorded information has been reviewed and is accurate.    Rolland PorterMark Shanaia Sievers, MD 03/07/14 1203  Rolland PorterMark Rube Sanchez, MD 03/07/14 [phone]

## 2014-03-07 NOTE — ED Notes (Addendum)
Pt with no PMH. Family unable to wake patient this am, nonverbal. EMS Afib 120's, 10 mg Cardizem given at 1015 Afib low 100s. CBG 160 Patient squeezes hands bilaterally, mute and lethagic. Bruise to right forehead area noted, family unaware of any incidents. Last seen at baseline last night: 03/06/2014 @ 2000.

## 2014-03-08 ENCOUNTER — Inpatient Hospital Stay (HOSPITAL_COMMUNITY): Payer: Medicare HMO

## 2014-03-08 ENCOUNTER — Encounter (HOSPITAL_COMMUNITY): Payer: Self-pay | Admitting: *Deleted

## 2014-03-08 DIAGNOSIS — I4891 Unspecified atrial fibrillation: Secondary | ICD-10-CM

## 2014-03-08 DIAGNOSIS — I63412 Cerebral infarction due to embolism of left middle cerebral artery: Secondary | ICD-10-CM

## 2014-03-08 DIAGNOSIS — I63432 Cerebral infarction due to embolism of left posterior cerebral artery: Secondary | ICD-10-CM

## 2014-03-08 DIAGNOSIS — I638 Other cerebral infarction: Secondary | ICD-10-CM

## 2014-03-08 DIAGNOSIS — J810 Acute pulmonary edema: Secondary | ICD-10-CM

## 2014-03-08 LAB — COMPREHENSIVE METABOLIC PANEL
ALBUMIN: 3.6 g/dL (ref 3.5–5.2)
ALK PHOS: 69 U/L (ref 39–117)
ALT: 9 U/L (ref 0–35)
AST: 19 U/L (ref 0–37)
Anion gap: 17 — ABNORMAL HIGH (ref 5–15)
BUN: 20 mg/dL (ref 6–23)
CALCIUM: 9.3 mg/dL (ref 8.4–10.5)
CO2: 17 mEq/L — ABNORMAL LOW (ref 19–32)
Chloride: 103 mEq/L (ref 96–112)
Creatinine, Ser: 1.27 mg/dL — ABNORMAL HIGH (ref 0.50–1.10)
GFR calc non Af Amer: 40 mL/min — ABNORMAL LOW (ref >90)
GFR, EST AFRICAN AMERICAN: 46 mL/min — AB (ref >90)
Glucose, Bld: 119 mg/dL — ABNORMAL HIGH (ref 70–99)
POTASSIUM: 4.5 meq/L (ref 3.7–5.3)
SODIUM: 137 meq/L (ref 137–147)
TOTAL PROTEIN: 8 g/dL (ref 6.0–8.3)
Total Bilirubin: 0.7 mg/dL (ref 0.3–1.2)

## 2014-03-08 LAB — LIPID PANEL
CHOL/HDL RATIO: 4.2 ratio
CHOLESTEROL: 207 mg/dL — AB (ref 0–200)
HDL: 49 mg/dL (ref >39)
LDL Cholesterol: 143 mg/dL — ABNORMAL HIGH (ref 0–99)
Triglycerides: 77 mg/dL (ref <150)
VLDL: 15 mg/dL (ref 0–40)

## 2014-03-08 LAB — GLUCOSE, CAPILLARY
GLUCOSE-CAPILLARY: 97 mg/dL (ref 70–99)
GLUCOSE-CAPILLARY: 134 mg/dL — AB (ref 70–99)
Glucose-Capillary: 124 mg/dL — ABNORMAL HIGH (ref 70–99)
Glucose-Capillary: 153 mg/dL — ABNORMAL HIGH (ref 70–99)

## 2014-03-08 LAB — HEMOGLOBIN A1C
HEMOGLOBIN A1C: 5.8 % — AB (ref <5.7)
Mean Plasma Glucose: 120 mg/dL — ABNORMAL HIGH (ref <117)

## 2014-03-08 LAB — CBC
HCT: 35.6 % — ABNORMAL LOW (ref 36.0–46.0)
Hemoglobin: 11.5 g/dL — ABNORMAL LOW (ref 12.0–15.0)
MCH: 29.5 pg (ref 26.0–34.0)
MCHC: 32.3 g/dL (ref 30.0–36.0)
MCV: 91.3 fL (ref 78.0–100.0)
Platelets: 198 10*3/uL (ref 150–400)
RBC: 3.9 MIL/uL (ref 3.87–5.11)
RDW: 13.7 % (ref 11.5–15.5)
WBC: 9.7 10*3/uL (ref 4.0–10.5)

## 2014-03-08 LAB — PROTIME-INR
INR: 1.21 (ref 0.00–1.49)
Prothrombin Time: 15.4 seconds — ABNORMAL HIGH (ref 11.6–15.2)

## 2014-03-08 LAB — APTT: APTT: 26 s (ref 24–37)

## 2014-03-08 LAB — TSH: TSH: 2.57 u[IU]/mL (ref 0.350–4.500)

## 2014-03-08 IMAGING — CT CT ANGIO NECK
1 of 10 series · 5 of 33 positions shown · IV contrast (Iohexol (Omnipaque 350))
Comparison: CT of the head [DATE]

CLINICAL DATA: Altered mental status, nonverbal. RIGHT forehead
bruising. Last seen at baseline 2 days ago.

EXAM:
CT ANGIOGRAPHY HEAD AND NECK
TECHNIQUE: Multidetector CT imaging of the head and neck was performed using
the standard protocol during bolus administration of intravenous
contrast. Multiplanar CT image reconstructions and MIPs were
obtained to evaluate the vascular anatomy. Carotid stenosis
measurements (when applicable) are obtained utilizing NASCET
criteria, using the distal internal carotid diameter as the
denominator.
CONTRAST:  50mL OMNIPAQUE IOHEXOL 350 MG/ML SOLN

[Series 407: 1x1 axial mips · axial · 0.43mm/px · z∈[+109,+336]mm · 5 of 325 slices shown]
[im 55/325  soft-tissue]
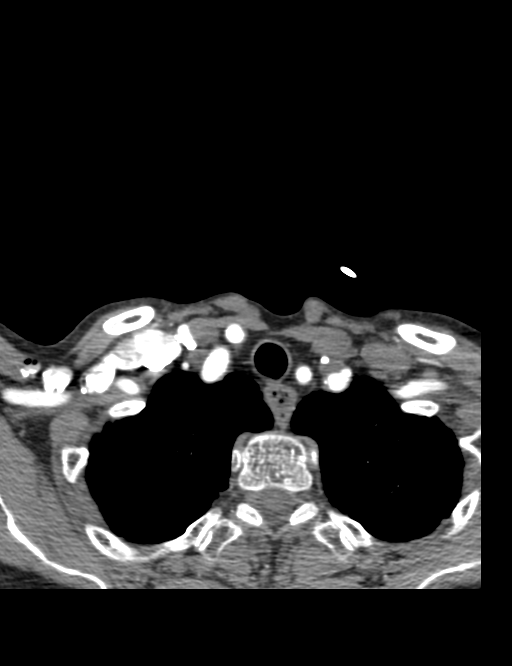
[im 109/325  bone]
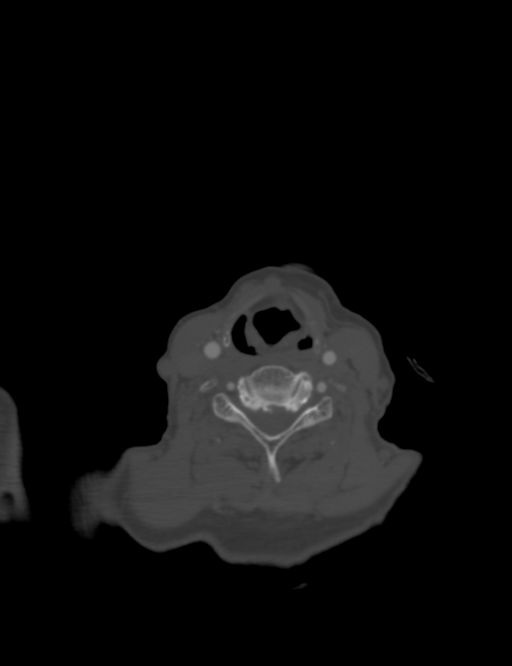
[im 163/325  soft-tissue]
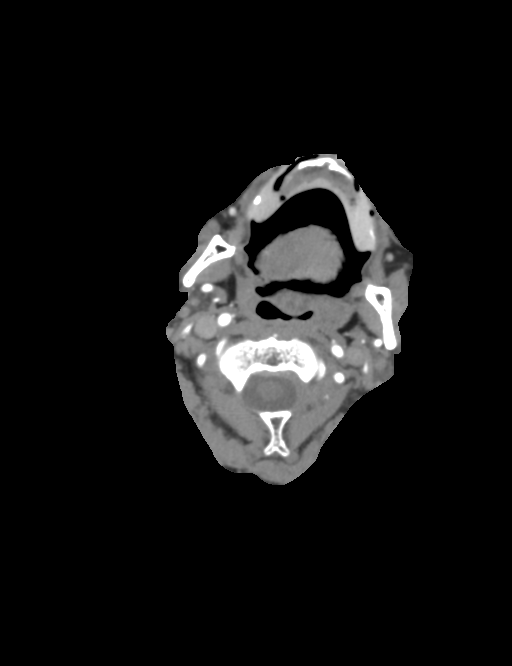
[im 217/325  bone]
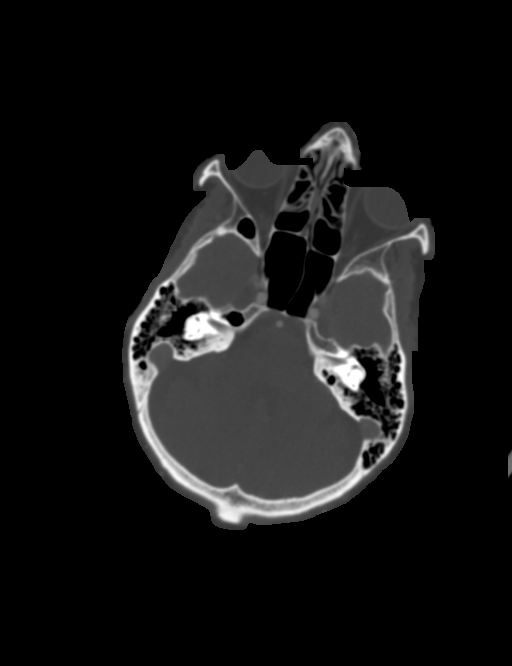
[im 271/325  soft-tissue]
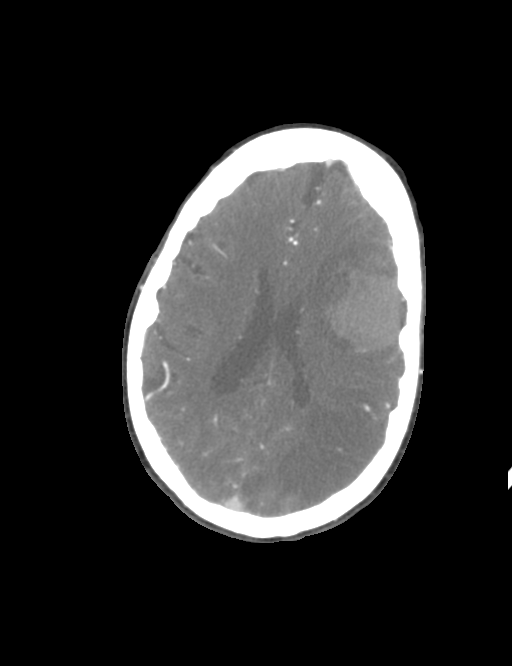

[5 of 33 positions shown; findings below may reference images not displayed]

FINDINGS: CTA HEAD FINDINGS

Anterior circulation: Normal appearance of the cervical internal
carotid arteries, petrous, cavernous and supra clinoid internal
carotid arteries. Widely patent anterior communicating artery.
Normal appearance of the anterior and middle cerebral arteries.

Posterior circulation: LEFT vertebral artery is dominant with normal
appearance of the vertebral arteries, vertebrobasilar junction and
basilar artery, as well as main branch vessels. Diminutive RIGHT P1
segment with robust compensatory contribution via RIGHT posterior
communicating artery. Normal appearance of proximal posterior
cerebral arteries. However, there is effacement of distal LEFT
posterior cerebral artery branches due to acute stroke. Occlusion of
a LEFT P3 vessel.

No large vessel occlusion, hemodynamically significant stenosis,
dissection, luminal irregularity, contrast extravasation or aneurysm
within the anterior nor posterior circulation.

Enlarging LEFT frontal lobe hemorrhage (was 3.6 x 2.8 cm, now 4.5 x
3.2 cm) with mass-effect, 5 mm LEFT-to-RIGHT midline shift.

Review of the MIP images confirms the above findings.

CTA NECK FINDINGS

Moderate calcific atherosclerosis of the aortic arch with normal
branch pattern. Origins of the innominate, left Common carotid
artery and subclavian artery are widely patent.

Bilateral Common carotid arteries are widely patent, coursing in a
straight line fashion. Calcific atherosclerosis of bilateral carotid
bulbs with 50% LEFT internal carotid artery stenosis by NASCET
criteria. Slightly beaded appearance of LEFT greater than RIGHT
cervical internal carotid arteries.

Left vertebral artery is dominant. Normal appearance of the
vertebral arteries, which appear widely patent.

No dissection, no pseudoaneurysm.  No contrast extravasation.

Soft tissues are unremarkable. No acute osseous process though bone
windows have not been submitted. Included view of the lung apices
demonstrates centrilobular emphysema.

Review of the MIP images confirms the above findings.
IMPRESSION: CTA HEAD: Enlarging LEFT frontal intraparenchymal hematoma with
increasing mass effect, 5 mm LEFT to RIGHT midline shift. No
underlying vascular abnormality.

Occlusion of the LEFT P3 segment, in a background of evolving large
LEFT posterior cerebral artery territory infarct, with edema
effacing the distal LEFT posterior cerebral artery branches.

CTA NECK: Very slightly beaded appearance of the cervical internal
carotid arteries, could reflect spasm, less likely fibromuscular
dysplasia.

50% stenosis of the LEFT internal carotid artery due to calcific
atherosclerosis.

  By: KEVELIN

## 2014-03-08 MED ORDER — CETYLPYRIDINIUM CHLORIDE 0.05 % MT LIQD
7.0000 mL | Freq: Two times a day (BID) | OROMUCOSAL | Status: DC
  Administered 2014-03-08 – 2014-03-11 (×7): 7 mL via OROMUCOSAL

## 2014-03-08 MED ORDER — FUROSEMIDE 10 MG/ML IJ SOLN
40.0000 mg | Freq: Once | INTRAMUSCULAR | Status: AC
  Administered 2014-03-08: 40 mg via INTRAVENOUS
  Filled 2014-03-08: qty 4

## 2014-03-08 MED ORDER — DILTIAZEM HCL 100 MG IV SOLR
5.0000 mg/h | INTRAVENOUS | Status: DC
  Administered 2014-03-08 (×2): 5 mg/h via INTRAVENOUS
  Filled 2014-03-08 (×2): qty 100

## 2014-03-08 MED ORDER — DILTIAZEM LOAD VIA INFUSION
20.0000 mg | Freq: Once | INTRAVENOUS | Status: AC
  Administered 2014-03-08: 20 mg via INTRAVENOUS
  Filled 2014-03-08: qty 20

## 2014-03-08 MED ORDER — IOHEXOL 350 MG/ML SOLN
50.0000 mL | Freq: Once | INTRAVENOUS | Status: AC | PRN
  Administered 2014-03-08: 50 mL via INTRAVENOUS

## 2014-03-08 MED ORDER — METOPROLOL TARTRATE 1 MG/ML IV SOLN
5.0000 mg | Freq: Once | INTRAVENOUS | Status: AC
  Administered 2014-03-08: 5 mg via INTRAVENOUS
  Filled 2014-03-08: qty 5

## 2014-03-08 NOTE — Progress Notes (Signed)
After family meeting with Dr. Roda ShuttersXu, patient's family informed this RN that they would not like to begin aggressive measures (i.e. Hypertonic saline, ventilator, ng tube) and wished to receive palliative care consult as their mother does not wish to be in nursing home long term or on ventilator. Dr. Roda ShuttersXu notified. Will monitor.

## 2014-03-08 NOTE — Progress Notes (Signed)
Spoke with son over phone. Will meet with family at 930 tomorrow morning.   Orvis BrillAaron J. Jammal Sarr D.O. Palliative Medicine Team at [facility]  Pager: [phone] Team Phone: [phone]

## 2014-03-08 NOTE — Evaluation (Signed)
Speech Language Pathology Evaluation Patient Details Name: Valerie Browning MRN: [id number] DOB: [birth year] Today's Date: 03/08/2014 Time: 1015-1040 SLP Time Calculation (min): 25 min  Problem List:  Patient Active Problem List   Diagnosis Date Noted  . CVA (cerebral infarction) 03/07/2014  . ICH (intracerebral hemorrhage) 03/07/2014   Past Medical History: No past medical history on file. Past Surgical History:  Past Surgical History  Procedure Laterality Date  . Abdominal hysterectomy     HPI:  78 y.o. female with  ischemic infarct with hemorrhagic conversion. CT shows Large 3.6 x 2.8 cm intraparenchymal hemorrhage left frontoparietal region with approximate midline shift of 4 mm from left to right and a large left occipital lobe infarct.    Assessment / Plan / Recommendation Clinical Impression  Pt presents with reduced arousal briefly focused attention to functional tasks and appearance of global aphasia, though this may appear more severe due to arousal. With max contextual, tactile, visual and verbal cues pt grasped cup and took a sip, held washcloth with attempt to wash face and made attempts to give and thumbs up on commands x3 immediately following teaching. Pt appears to only respond to contextual rather than verbal cues, indicating probable receptive aphasia. Pt also with no attempt to communicate verbally or with gestures. Recommend SLP f/u acutely to maximize functional communication. Based on today's observation, pt will likely need SNF level therapy.     SLP Assessment  Patient needs continued Speech Lanaguage Pathology Services    Follow Up Recommendations  Skilled Nursing facility    Frequency and Duration min 2x/week  2 weeks   Pertinent Vitals/Pain Pain Assessment: Faces Pain Score: 0-No pain   SLP Goals  Progression toward goals: Progressing toward goals Potential to Achieve Goals: Fair  SLP Evaluation Prior Functioning  Cognitive/Linguistic Baseline:  Within functional limits   Cognition  Overall Cognitive Status: Impaired/Different from baseline Arousal/Alertness: Lethargic Orientation Level: Other (comment) (aphasia) Attention: Focused Focused Attention: Impaired Focused Attention Impairment: Verbal basic;Functional basic (brief focused attention to basic functional tasks)    Comprehension  Auditory Comprehension Overall Auditory Comprehension: Impaired Yes/No Questions: Impaired Basic Biographical Questions: 0-25% accurate Commands: Impaired One Step Basic Commands: 0-24% accurate (may have attempted thumbs up after teaching x3. ) Interfering Components: Attention Visual Recognition/Discrimination Discrimination: Exceptions to [facility] Common Objects: Unable to indentify Reading Comprehension Reading Status: Not tested    Expression Verbal Expression Overall Verbal Expression: Impaired (no response with max cueing) Initiation: Impaired Common Objects: Unable to indentify   Oral / Motor Oral Motor/Sensory Function Overall Oral Motor/Sensory Function: Other (comment) (pt does not follow oral motor commands) Labial ROM: Reduced right Labial Symmetry: Abnormal symmetry right Labial Strength: Reduced   GO    Harlon DittyBonnie Keira Bohlin, MA CCC-SLP [phone]  Claudine MoutonDeBlois, Adelin Ventrella Caroline 03/08/2014, 10:53 AM

## 2014-03-08 NOTE — Evaluation (Signed)
Clinical/Bedside Swallow Evaluation Patient Details  Name: Donivan Scullmma F Pohlmann MRN: [id number] Date of Birth: 02-Feb-1936  Today's Date: 03/08/2014 Time: 1015-1040 SLP Time Calculation (min): 25 min  Past Medical History: No past medical history on file. Past Surgical History:  Past Surgical History  Procedure Laterality Date  . Abdominal hysterectomy     HPI:  78 y.o. female with  ischemic infarct with hemorrhagic conversion. CT shows Large 3.6 x 2.8 cm intraparenchymal hemorrhage left frontoparietal region with approximate midline shift of 4 mm from left to right and a large left occipital lobe infarct.    Assessment / Plan / Recommendation Clinical Impression  Pt demonstrates reduced arousal, significant right labial weakness, delayed swallow and immeidate evidence of aspiration with small sip of thin liquids. Pt was able to grasp cup and initaite intake and also initaite swallow, but given mentation and neuromuscular deficits, pt is not appropriate for PO or objective assessment. Will continue to attempt trials and await improved arousal for objective testing if warranted. Pt to remain NPO for now, may need short term alternative nutrition.     Aspiration Risk  Severe    Diet Recommendation NPO;Alternative means - temporary   Medication Administration: Via alternative means    Other  Recommendations Oral Care Recommendations: Oral care Q4 per protocol   Follow Up Recommendations  Skilled Nursing facility    Frequency and Duration min 2x/week  2 weeks   Pertinent Vitals/Pain NA    SLP Swallow Goals     Swallow Study Prior Functional Status       General HPI: 78 y.o. female with  ischemic infarct with hemorrhagic conversion. CT shows Large 3.6 x 2.8 cm intraparenchymal hemorrhage left frontoparietal region with approximate midline shift of 4 mm from left to right and a large left occipital lobe infarct.  Type of Study: Bedside swallow evaluation Previous Swallow Assessment:  none Diet Prior to this Study: NPO Temperature Spikes Noted: No Respiratory Status: Room air History of Recent Intubation: No Behavior/Cognition: Doesn't follow directions;Decreased sustained attention;Lethargic Self-Feeding Abilities: Needs assist Patient Positioning: Upright in bed Baseline Vocal Quality: Other (comment) (no attempts to phonate) Volitional Cough: Cognitively unable to elicit Volitional Swallow: Unable to elicit    Oral/Motor/Sensory Function Overall Oral Motor/Sensory Function: Other (comment) (does not follow commands) Labial ROM: Reduced right Labial Symmetry: Abnormal symmetry right Labial Strength: Reduced   Ice Chips Ice chips: Not tested   Thin Liquid Thin Liquid: Impaired Presentation: Cup;Self Fed Oral Phase Impairments: Reduced labial seal;Impaired anterior to posterior transit;Reduced lingual movement/coordination Oral Phase Functional Implications: Right anterior spillage Pharyngeal  Phase Impairments: Suspected delayed Swallow;Cough - Immediate    Nectar Thick Nectar Thick Liquid: Not tested   Honey Thick Honey Thick Liquid: Not tested   Puree Puree: Not tested   Solid   GO    Solid: Not tested      Harlon DittyBonnie Amberrose Friebel, MA CCC-SLP [id number]  Houston Zapien, Riley NearingBonnie Caroline 03/08/2014,10:45 AM

## 2014-03-08 NOTE — Progress Notes (Signed)
RT called to patients room for sats in the upper 70's. Sats came up with no change of patient. Patient is tachycardic. Patient sounds wet upon ausculation. No breathing treatment needed at this time.

## 2014-03-08 NOTE — Progress Notes (Signed)
STROKE TEAM PROGRESS NOTE   HISTORY Valerie Browning is an 78 y.o. female who was [facility] after family found patient in AM not acting herself. Patient was seen last night around 2000 hours, and apparently she was awake and walking and talking and at baseline.. When they went to check on her this morning, she was sitting apparently at home. She had a bruise on her right forehead was unable to speak with them. He called 911. She was brought to the hospital. She presents here 14 hours after her last time known normal.  CT head showed a right ICH. [facility] was brought to [facility] and per report found to be in Afib with RVR. She was given Cardizem and rates have been in the 80's. patient was then transferred to [facility]. Currently she is non verbal, follows no commands and is moving left>right. BP 158/98.  Date last known well: Date: 03/06/2014  Time last known well: Time: 20:00  tPA Given: No: ICH  SUBJECTIVE (INTERVAL HISTORY) Son is at the bedside.  Overall she feels her condition is declining. She was found to have afib with RVR which confirmed on tele and EKG. She also developed acute pulmonary edema with afib RVR. Given lasix and put on diltiazem drip. Had long discussion with family and they wanted no aggressive measures and would like palliative care consult.   OBJECTIVE Temp:  [98.5 F (36.9 C)-99.3 F (37.4 C)] 99 F (37.2 C) (10/17 1625) Pulse Rate:  [47-138] 131 (10/17 1700) Cardiac Rhythm:  [-] Normal sinus rhythm (10/17 0800) Resp:  [16-31] 25 (10/17 1700) BP: (91-151)/(54-92) 149/70 mmHg (10/17 1700) SpO2:  [85 %-100 %] 87 % (10/17 1700) FiO2 (%):  [40 %-45 %] 45 % (10/17 1700)   Recent Labs Lab 03/07/14 2330 03/08/14 0804 03/08/14 1205 03/08/14 1608  GLUCAP 124* 97 153* 134*    Recent Labs Lab 03/07/14 1046 03/08/14 0259  NA 136* 137  K 4.3 4.5  CL 100 103  CO2 20 17*  GLUCOSE 124* 119*  BUN 15 20  CREATININE 1.08 1.27*  CALCIUM 9.7 9.3     Recent Labs Lab 03/08/14 0259  AST 19  ALT 9  ALKPHOS 69  BILITOT 0.7  PROT 8.0  ALBUMIN 3.6    Recent Labs Lab 03/07/14 1046 03/08/14 0259  WBC 8.5 9.7  NEUTROABS 6.5  --   HGB 13.8 11.5*  HCT 41.6 35.6*  MCV 90.4 91.3  PLT 212 198    Recent Labs Lab 03/07/14 1046  TROPONINI <0.30    Recent Labs  03/07/14 1046 03/08/14 0259  LABPROT 14.5 15.4*  INR 1.12 1.21    Recent Labs  03/07/14 1143  COLORURINE YELLOW  LABSPEC 1.010  PHURINE 7.5  GLUCOSEU NEGATIVE  HGBUR TRACE*  BILIRUBINUR NEGATIVE  KETONESUR TRACE*  PROTEINUR NEGATIVE  UROBILINOGEN 0.2  NITRITE NEGATIVE  LEUKOCYTESUR NEGATIVE       Component Value Date/Time   CHOL 207* 03/08/2014 0259   TRIG 77 03/08/2014 0259   HDL 49 03/08/2014 0259   CHOLHDL 4.2 03/08/2014 0259   VLDL 15 03/08/2014 0259   LDLCALC 143* 03/08/2014 0259   Lab Results  Component Value Date   HGBA1C 5.8* 03/08/2014   No results found for this basename: labopia, cocainscrnur, labbenz, amphetmu, thcu, labbarb    No results found for this basename: ETH,  in the last 168 hours  Ct Angio Head and neck W/cm &/or Wo Cm  03/08/2014   IMPRESSION: CTA  HEAD: Enlarging LEFT frontal intraparenchymal hematoma with increasing mass effect, 5 mm LEFT to RIGHT midline shift. No underlying vascular abnormality.  Occlusion of the LEFT P3 segment, in a background of evolving large LEFT posterior cerebral artery territory infarct, with edema effacing the distal LEFT posterior cerebral artery branches.  CTA NECK: Very slightly beaded appearance of the cervical internal carotid arteries, could reflect spasm, less likely fibromuscular dysplasia.  50% stenosis of the LEFT internal carotid artery due to calcific atherosclerosis.    Ct Head Wo Contrast  03/07/2014   IMPRESSION: 1. Large 3.6 x 2.8 cm intraparenchymal hemorrhage left frontoparietal region with approximate midline shift of 4 mm from left to right . 2. Large left occipital lobe  infarct.   Dg Chest Portable 1 View  03/08/2014   IMPRESSION: Findings suggesting mild vascular congestion.     Dg Chest Port 1 View  03/07/2014   IMPRESSION: 1. Pulmonary vascular congestion noted.        PHYSICAL EXAM Physical exam  Temp:  [98.5 F (36.9 C)-99.3 F (37.4 C)] 99 F (37.2 C) (10/17 1625) Pulse Rate:  [47-138] 131 (10/17 1700) Resp:  [16-31] 25 (10/17 1700) BP: (91-151)/(54-92) 149/70 mmHg (10/17 1700) SpO2:  [85 %-100 %] 87 % (10/17 1700) FiO2 (%):  [40 %-45 %] 45 % (10/17 1700)  General - Well nourished, well developed, in mild respiratory distress on face mask after lasix for acute pulmonary edema.  Ophthalmologic - not able to cooperate on exam.  Cardiovascular - irregularly irregular heart rate and rhythm.  Lung - decreased breath sound on the left, wheezing on the right.  Neuro - lethargic, open eyes on voice stimulation but global aphasia, PERRL, not blink to threat on the right, right facial droop, tongue in middle inside mouth, right UE trace withdraw to pain but right LE against gravity on pain stimulation. LUE and LLE move spontaneously. Reflex 1+, bilateral babinski   ASSESSMENT/PLAN Ms. Valerie Browning is a 78 y.o. female with unclear PMH presenting with aphasia and right sided hemiparesis. CT head showed left MCA and PCA stroke with left frontal hemorrhagic transformation. She was found to have afib in ICU. She did not receive IV t-PA due to hemorrhage.   Stroke:  Left PCA and left MCA stroke with left frontal hemorrhagic transformation - likely due to new diagnosed afib with RVR    CT showed left PCA and MCA infarction with left frontal hemorrhage.  2D Echo pending  LDL 143, not at goal < 70  HgbA1c 5.8  SCDs for VTE prophylaxis  NPO   Bedrest  no antithrombotics prior to admission, now on no antithrombotics due to hemorrhage  Resultant right hemianopia, right hemiparesis, global aphasia.  Had long discussion with sons and they  requested palliative care given the poor prognosis and pt wishes not want to live if no meaningful neurological recovery. I agree and palliative care consult called.  Cerebral edema - CT showed increased midline shift - consistent with large PCA and MCA stroke with left frontal hemorrhage - discussed 3% saline with family and they declined - will continue monitor neuro status.  Afib with RVR and pulmonary edema - lasix 40mg  iv - metoprolol 5mg  iv  - diltiazem loading and drip - face mask - close monitoring - clinically improving  Hypertension  Home meds:   none  Unstable  BP goal < 140  Family requested palliative care, will not initiate any aggressive measures.  Hyperlipidemia  Home meds:  none  LDL  143  Hold off any statin due to bleeding  Other Stroke Risk Factors Advanced age  Other Active Problems  CHF  Hospital day # 1  This patient is critically ill due to large stroke with hemorrhagic transformation, afib with RVR and at significant risk of neurological worsening, death form hematoma expansion, brain herniation and cerebral edema, heart failure and respiratory distress. This patient's care requires constant monitoring of vital signs, hemodynamics, respiratory and cardiac monitoring, review of multiple databases, neurological assessment, discussion with family, other specialists and medical decision making of high complexity. I spent 55 minutes of neurocritical care time in the care of this patient.   Marvel Plan, MD PhD Stroke Neurology 03/08/2014 6:02 PM    To contact Stroke Continuity provider, please refer to [email or web address]. After hours, contact General Neurology

## 2014-03-08 NOTE — Progress Notes (Signed)
Called to patient's room for patient trying to get out of bed. Upon entering, patient's saturations had decreased, and pt converted to afib, HR in 120's-130's. Pt sounded congested. Dr. Roda ShuttersXu notified, orders received for lasix, metoprolol, diltalizem drip, and foley catheter. Will complete and monitor.

## 2014-03-09 DIAGNOSIS — Z515 Encounter for palliative care: Secondary | ICD-10-CM

## 2014-03-09 DIAGNOSIS — R0600 Dyspnea, unspecified: Secondary | ICD-10-CM

## 2014-03-09 DIAGNOSIS — K117 Disturbances of salivary secretion: Secondary | ICD-10-CM

## 2014-03-09 DIAGNOSIS — R0689 Other abnormalities of breathing: Secondary | ICD-10-CM

## 2014-03-09 LAB — GLUCOSE, CAPILLARY
GLUCOSE-CAPILLARY: 122 mg/dL — AB (ref 70–99)
GLUCOSE-CAPILLARY: 142 mg/dL — AB (ref 70–99)

## 2014-03-09 MED ORDER — LORAZEPAM 2 MG/ML IJ SOLN: 0.5000 mg | INTRAMUSCULAR | Status: DC | PRN

## 2014-03-09 MED ORDER — WHITE PETROLATUM GEL
Status: AC
  Administered 2014-03-09: 0.2
  Filled 2014-03-09: qty 5

## 2014-03-09 MED ORDER — CHLORHEXIDINE GLUCONATE CLOTH 2 % EX PADS: 6.0000 | MEDICATED_PAD | Freq: Every day | CUTANEOUS | Status: DC

## 2014-03-09 MED ORDER — SCOPOLAMINE 1 MG/3DAYS TD PT72
1.0000 | MEDICATED_PATCH | TRANSDERMAL | Status: DC
  Administered 2014-03-09: 1.5 mg via TRANSDERMAL
  Filled 2014-03-09: qty 1

## 2014-03-09 MED ORDER — MUPIROCIN 2 % EX OINT: 1.0000 "application " | TOPICAL_OINTMENT | Freq: Two times a day (BID) | CUTANEOUS | Status: DC

## 2014-03-09 MED ORDER — ATROPINE SULFATE 1 % OP SOLN
4.0000 [drp] | OPHTHALMIC | Status: DC | PRN
  Administered 2014-03-09: 4 [drp] via SUBLINGUAL
  Filled 2014-03-09 (×2): qty 2

## 2014-03-09 MED ORDER — MORPHINE SULFATE 2 MG/ML IJ SOLN: 2.0000 mg | INTRAMUSCULAR | Status: DC | PRN

## 2014-03-09 MED ORDER — MORPHINE SULFATE 2 MG/ML IJ SOLN
2.0000 mg | INTRAMUSCULAR | Status: DC
  Administered 2014-03-09 – 2014-03-11 (×13): 2 mg via INTRAVENOUS
  Filled 2014-03-09 (×14): qty 1

## 2014-03-09 NOTE — Progress Notes (Signed)
STROKE TEAM PROGRESS NOTE   HISTORY Valerie Browning is an 78 y.o. female who was [facility] after family found patient in AM not acting herself. Patient was seen last night around 2000 hours, and apparently she was awake and walking and talking and at baseline.. When they went to check on her this morning, she was sitting apparently at home. She had a bruise on her right forehead was unable to speak with them. He called 911. She was brought to the hospital. She presents here 14 hours after her last time known normal.  CT head showed a right ICH. [facility] was brought to [facility] and per report found to be in Afib with RVR. She was given Cardizem and rates have been in the 80's. patient was then transferred to [facility]. Currently she is non verbal, follows no commands and is moving left>right. BP 158/98.  Date last known well: Date: 03/06/2014  Time last known well: Time: 20:00  tPA Given: No: ICH  SUBJECTIVE (INTERVAL HISTORY) Sister and grandson are at the bedside.  Overall she feels her condition is unchanged. Sleepy but awake on voice, tracking for objects. Global aphasia. Respiratory stable overnight.   OBJECTIVE Temp:  [98.4 F (36.9 C)-99.6 F (37.6 C)] 98.4 F (36.9 C) (10/18 1153) Pulse Rate:  [60-121] 87 (10/18 1153) Cardiac Rhythm:  [-] Atrial fibrillation (10/18 0800) Resp:  [19-26] 24 (10/18 1153) BP: (99-145)/(49-78) 121/62 mmHg (10/18 1153) SpO2:  [88 %-98 %] 89 % (10/18 1153) FiO2 (%):  [40 %-50 %] 50 % (10/18 1000)   Recent Labs Lab 03/08/14 0804 03/08/14 1205 03/08/14 1608 03/08/14 2357 03/09/14 0750  GLUCAP 97 153* 134* 122* 142*    Recent Labs Lab 03/07/14 1046 03/08/14 0259  NA 136* 137  K 4.3 4.5  CL 100 103  CO2 20 17*  GLUCOSE 124* 119*  BUN 15 20  CREATININE 1.08 1.27*  CALCIUM 9.7 9.3    Recent Labs Lab 03/08/14 0259  AST 19  ALT 9  ALKPHOS 69  BILITOT 0.7  PROT 8.0  ALBUMIN 3.6    Recent Labs Lab 03/07/14 1046  03/08/14 0259  WBC 8.5 9.7  NEUTROABS 6.5  --   HGB 13.8 11.5*  HCT 41.6 35.6*  MCV 90.4 91.3  PLT 212 198    Recent Labs Lab 03/07/14 1046  TROPONINI <0.30    Recent Labs  03/07/14 1046 03/08/14 0259  LABPROT 14.5 15.4*  INR 1.12 1.21    Recent Labs  03/07/14 1143  COLORURINE YELLOW  LABSPEC 1.010  PHURINE 7.5  GLUCOSEU NEGATIVE  HGBUR TRACE*  BILIRUBINUR NEGATIVE  KETONESUR TRACE*  PROTEINUR NEGATIVE  UROBILINOGEN 0.2  NITRITE NEGATIVE  LEUKOCYTESUR NEGATIVE       Component Value Date/Time   CHOL 207* 03/08/2014 0259   TRIG 77 03/08/2014 0259   HDL 49 03/08/2014 0259   CHOLHDL 4.2 03/08/2014 0259   VLDL 15 03/08/2014 0259   LDLCALC 143* 03/08/2014 0259   Lab Results  Component Value Date   HGBA1C 5.8* 03/08/2014   No results found for this basename: labopia,  cocainscrnur,  labbenz,  amphetmu,  thcu,  labbarb    No results found for this basename: ETH,  in the last 168 hours  Ct Angio Head and neck W/cm &/or Wo Cm  03/08/2014   IMPRESSION: CTA HEAD: Enlarging LEFT frontal intraparenchymal hematoma with increasing mass effect, 5 mm LEFT to RIGHT midline shift. No underlying vascular abnormality.  Occlusion of the  LEFT P3 segment, in a background of evolving large LEFT posterior cerebral artery territory infarct, with edema effacing the distal LEFT posterior cerebral artery branches.  CTA NECK: Very slightly beaded appearance of the cervical internal carotid arteries, could reflect spasm, less likely fibromuscular dysplasia.  50% stenosis of the LEFT internal carotid artery due to calcific atherosclerosis.    Ct Head Wo Contrast  03/07/2014   IMPRESSION: 1. Large 3.6 x 2.8 cm intraparenchymal hemorrhage left frontoparietal region with approximate midline shift of 4 mm from left to right . 2. Large left occipital lobe infarct.   Dg Chest Portable 1 View  03/08/2014   IMPRESSION: Findings suggesting mild vascular congestion.     Dg Chest Port 1  View  03/07/2014   IMPRESSION: 1. Pulmonary vascular congestion noted.       PHYSICAL EXAM  Temp:  [98.4 F (36.9 C)-99.6 F (37.6 C)] 98.4 F (36.9 C) (10/18 1153) Pulse Rate:  [60-121] 87 (10/18 1153) Resp:  [19-26] 24 (10/18 1153) BP: (99-145)/(49-78) 121/62 mmHg (10/18 1153) SpO2:  [88 %-98 %] 89 % (10/18 1153) FiO2 (%):  [40 %-50 %] 50 % (10/18 1000)  General - Well nourished, well developed, on face mask.  Ophthalmologic - not able to cooperate on exam.  Cardiovascular - irregularly irregular heart rate and rhythm.  Lung - decreased breath sound on the left, clear on the right.  Neuro - sleepy, but open eyes on voice stimulation. Still global aphasia, PERRL, not blink to threat on the right, right facial droop, tongue in middle inside mouth, right UE trace withdraw to pain but right LE against gravity on pain stimulation. LUE and LLE move spontaneously. Reflex 1+, bilateral babinski   ASSESSMENT/PLAN Ms. Valerie Browning is a 78 y.o. female with unclear PMH presenting with aphasia and right sided hemiparesis. CT head showed left MCA and PCA stroke with left frontal hemorrhagic transformation. She was found to have afib in ICU. She did not receive IV t-PA due to hemorrhage.   Stroke:  Left PCA and left MCA stroke with left frontal hemorrhagic transformation - likely due to new diagnosed afib with RVR    CT showed left PCA and MCA infarction with left frontal hemorrhage.  2D Echo cancelled due to comfort care orders placed.  LDL 143, not at goal < 70  HgbA1c 5.8  SCDs for VTE prophylaxis  NPO   Bedrest  no antithrombotics prior to admission, now on no antithrombotics due to hemorrhage  Resultant right hemianopia, right hemiparesis, global aphasia.  Dr. Greig RightLampkin talked with family and they agreed to proceed with palliative care. Pt will be transferred to [facility].   Cerebral edema - CT showed increased midline shift - consistent with large PCA and MCA stroke with left  frontal hemorrhage - discussed 3% saline with family and they declined - family optioned for palliative care  Afib with RVR and pulmonary edema - hold off aggressive measure due to palliative care in place  Hypertension  Home meds:   none  Unstable  BP goal < 140  Family requested palliative care, will not initiate any aggressive measures.  Hyperlipidemia  Home meds:  none  LDL 143  Hold off any statin due to palliative care  Other Stroke Risk Factors Advanced age  Other Active Problems  CHF  Hospital day # 2  Marvel PlanJindong Koltin Wehmeyer, MD PhD Stroke Neurology 03/09/2014 5:03 PM    To contact Stroke Continuity provider, please refer to [email or web address]. After hours, contact General Neurology

## 2014-03-09 NOTE — Consult Note (Signed)
Patient ZO:XWRU:Valerie Browning      DOB: 1935-09-26      EAV:[id number]     Consult [facility]    Consult Requested by: Dr Roda ShuttersXu    PCP: No PCP Per Patient Reason for Consultation:EOL care     Phone Number:None  Assessment/Recommendations:  78 yo female transferred from [facility] after being found down at home. Hemorrhagic CVA with cerebral edema and midline shift.   1.  Code Status: DNR  2. Goals of Care: Full comfort. Will transition to only comfort meds. If able to tolerate pure comfort transition potentially could transfer to residential hospice.  Will see how she is doing in the morning.   3. Symptom Management:   1. Anxiety/Agitation: ativan PRN 2. Pain/Dyspnea: Will schedule low dose morphine and allow PRN. If needing PRN's can consider morphine infusion. Titrate to Nasal Cannula for comfort. Would not adjust O2 based on sats but rather patient comfort 3. Terminal Secretions: scopolamine patch, atropine drops  4. Psychosocial/Spiritual: Very spiritual woman and family. Lost her husband few years ago. [place]   Brief HPI: Patient is a 78 yo female transferred from [facility] after being found down at home. Previously was at baseline and functioned independently. CT head on admission showed large left hemorrhagic CVA. She was also found to be in Afib with RVR. [facility] Repeat imaging showed enlarging hematoma with midline shift. Neurology had long discussion with family who elected to pursue comfort care.    This morning, she is able to squeeze my hand with her left but not enough consistent following of commands to answer objective ROS questions      PMH: History reviewed. No pertinent past medical history.   PSH: Past Surgical History  Procedure Laterality Date  . Abdominal hysterectomy     I have reviewed the FH and SH and  If appropriate update it with new information. No Known  Allergies Scheduled Meds: .  stroke: mapping our early stages of recovery book   Does not apply Once  . antiseptic oral rinse  7 mL Mouth Rinse BID  . Chlorhexidine Gluconate Cloth  6 each Topical Q0600  . Influenza vac split quadrivalent PF  0.5 mL Intramuscular Tomorrow-1000  . mupirocin ointment  1 application Nasal BID  . pantoprazole (PROTONIX) IV  40 mg Intravenous QHS  . pneumococcal 23 valent vaccine  0.5 mL Intramuscular Tomorrow-1000  . senna-docusate  1 tablet Oral BID   Continuous Infusions: . sodium chloride 50 mL/hr at 03/08/14 2246  . diltiazem (CARDIZEM) infusion 7.5 mg/hr (03/09/14 0436)   PRN Meds:.acetaminophen, acetaminophen, labetalol    BP 99/51  Pulse 78  Temp(Src) 98.6 F (37 C) (Axillary)  Resp 23  Ht 5\' 3"  (1.6 m)  Wt 61 kg (134 lb 7.7 oz)  BMI 23.83 kg/m2  SpO2 98%   PPS: 10   Intake/Output Summary (Last 24 hours) at 03/09/14 0850 Last data filed at 03/09/14 0700  Gross per 24 hour  Intake 1340.41 ml  Output   1775 ml  Net -434.59 ml    Physical Exam:  General: Awake, NAD HEENT: [place], mmm Chest:   CTAB Abdomen: soft Ext: warm Neuro: intermittently follows commands  Labs: CBC    Component Value Date/Time   WBC 9.7 03/08/2014 0259   RBC 3.90 03/08/2014 0259   HGB 11.5* 03/08/2014 0259   HCT 35.6* 03/08/2014 0259   PLT 198 03/08/2014 0259  MCV 91.3 03/08/2014 0259   MCH 29.5 03/08/2014 0259   MCHC 32.3 03/08/2014 0259   RDW 13.7 03/08/2014 0259   LYMPHSABS 1.5 03/07/2014 1046   MONOABS 0.4 03/07/2014 1046   EOSABS 0.0 03/07/2014 1046   BASOSABS 0.0 03/07/2014 1046    BMET    Component Value Date/Time   NA 137 03/08/2014 0259   K 4.5 03/08/2014 0259   CL 103 03/08/2014 0259   CO2 17* 03/08/2014 0259   GLUCOSE 119* 03/08/2014 0259   BUN 20 03/08/2014 0259   CREATININE 1.27* 03/08/2014 0259   CALCIUM 9.3 03/08/2014 0259   GFRNONAA 40* 03/08/2014 0259   GFRAA 46* 03/08/2014 0259    CMP     Component Value  Date/Time   NA 137 03/08/2014 0259   K 4.5 03/08/2014 0259   CL 103 03/08/2014 0259   CO2 17* 03/08/2014 0259   GLUCOSE 119* 03/08/2014 0259   BUN 20 03/08/2014 0259   CREATININE 1.27* 03/08/2014 0259   CALCIUM 9.3 03/08/2014 0259   PROT 8.0 03/08/2014 0259   ALBUMIN 3.6 03/08/2014 0259   AST 19 03/08/2014 0259   ALT 9 03/08/2014 0259   ALKPHOS 69 03/08/2014 0259   BILITOT 0.7 03/08/2014 0259   GFRNONAA 40* 03/08/2014 0259   GFRAA 46* 03/08/2014 0259   10/16 CT  Head IMPRESSION:  1. Large 3.6 x 2.8 cm intraparenchymal hemorrhage left  frontoparietal region with approximate midline shift of 4 mm from  left to right .  2. Large left occipital lobe infarct. Critical Value/emergent  results were called by telephone at the time of interpretation on  03/07/2014 at 11:36 am to Dr. Rolland PorterMARK JAMES , who verbally acknowledged  these results.  03/08/14 IMPRESSION:  CTA HEAD: Enlarging LEFT frontal intraparenchymal hematoma with  increasing mass effect, 5 mm LEFT to RIGHT midline shift. No  underlying vascular abnormality.  Occlusion of the LEFT P3 segment, in a background of evolving large  LEFT posterior cerebral artery territory infarct, with edema  effacing the distal LEFT posterior cerebral artery branches.  CTA NECK: Very slightly beaded appearance of the cervical internal  carotid arteries, could reflect spasm, less likely fibromuscular  dysplasia.  50% stenosis of the LEFT internal carotid artery due to calcific  atherosclerosis.   Total Time 60 minutes  Greater than 50%  of this time was spent counseling and coordinating care related to the above assessment and plan.  Orvis BrillAaron J. Erskin Zinda D.O. Palliative Medicine Team at [facility]  Pager: [phone] Team Phone: [phone]

## 2014-03-10 DIAGNOSIS — I10 Essential (primary) hypertension: Secondary | ICD-10-CM | POA: Diagnosis present

## 2014-03-10 DIAGNOSIS — E785 Hyperlipidemia, unspecified: Secondary | ICD-10-CM | POA: Diagnosis present

## 2014-03-10 DIAGNOSIS — G936 Cerebral edema: Secondary | ICD-10-CM | POA: Diagnosis present

## 2014-03-10 DIAGNOSIS — I4891 Unspecified atrial fibrillation: Secondary | ICD-10-CM | POA: Diagnosis present

## 2014-03-10 LAB — T4, FREE: FREE T4: 0.99 ng/dL (ref 0.80–1.80)

## 2014-03-10 MED ORDER — SCOPOLAMINE 1 MG/3DAYS TD PT72: 1.0000 | MEDICATED_PATCH | TRANSDERMAL | Status: AC

## 2014-03-10 MED ORDER — ATROPINE SULFATE 1 % OP SOLN: 4.0000 [drp] | OPHTHALMIC | Status: AC | PRN

## 2014-03-10 NOTE — Progress Notes (Signed)
STROKE TEAM PROGRESS NOTE   HISTORY Valerie Browning is an 78 y.o. female who [facility] after family found patient in AM not acting herself. Patient was seen last night 03/06/2014 around 2000 hours, and apparently she was awake and walking and talking and at baseline.. When they went to check on her this morning, she was sitting apparently at home. She had a bruise on her right forehead was unable to speak with them. He called 911. She was brought to the hospital. She presents here 14 hours after her last time known normal. CT head showed a right ICH, therefore was not administered IV tPA. [facility] and per report found to be in Afib with RVR. She was given Cardizem and rates have been in the 80's. patient was then [facility]. Currently she is non verbal, follows no commands and is moving left>right. BP 158/98.    SUBJECTIVE (INTERVAL HISTORY) Multiple family members at the bedside. They are considering discharged to hospice home.   OBJECTIVE Temp:  [98.2 F (36.8 C)-98.4 F (36.9 C)] 98.2 F (36.8 C) (10/19 0559) Pulse Rate:  [87-105] 105 (10/19 0559) Cardiac Rhythm:  [-]  Resp:  [22-24] 22 (10/19 0559) BP: (121)/(62) 121/62 mmHg (10/18 1153) SpO2:  [89 %-91 %] 91 % (10/19 0559)   PHYSICAL EXAM General - Well nourished, well developed, on face mask.  Ophthalmologic - not able to cooperate on exam.  Cardiovascular - irregularly irregular heart rate and rhythm.  Lung - decreased breath sound on the left, clear on the right.  Neuro - sleepy, but open eyes on voice stimulation. Still global aphasia, PERRL, not blink to threat on the right, right facial droop, tongue in middle inside mouth, right UE trace withdraw to pain but right LE against gravity on pain stimulation. LUE and LLE move spontaneously. Reflex 1+, bilateral babinski   ASSESSMENT/PLAN Ms. Valerie Browning is a 78 y.o. female with unclear PMH presenting with  aphasia and right sided hemiparesis. CT head showed left MCA and PCA stroke with left frontal hemorrhagic transformation. She was found to have afib in ICU. She did not receive IV t-PA due to hemorrhage.   Stroke:  Left PCA and left MCA stroke with mass affect and 5 mm left to right midline shift from cerebral edema, left frontal hemorrhagic transformation - infarcts likely due to new diagnosed afib with RVR. Patient with resultant global aphasia, lethargy, dysphagia, right hemiparesis.  CT showed left PCA and MCA infarction with left frontal hemorrhagic transformation  2D Echo cancelled due to comfort care orders placed.  LDL 143, not at goal < 70  HgbA1c 5.8  SCDs for VTE prophylaxis  NPO   Bedrest  no antithrombotics prior to admission, now on no antithrombotics due to hemorrhage  Neurologic prognosis for recovery is poor   Palliative care consulted. Dr. Greig RightLampkin talked with family. Patient made comfort care yesterday. SW assisting with discharge disposition today.   Cerebral edema - CT showed increased midline shift - consistent with large PCA and MCA stroke with left frontal hemorrhage - discussed 3% saline with family and they declined - family optioned for palliative care  Afib with RVR and pulmonary edema - hold off aggressive measure due to palliative care in place  Hypertension  Home meds:   none  Unstable  BP goal < 140  Family requested palliative care, will not initiate any aggressive measures.  Hyperlipidemia  Home meds:  none  LDL 143  Hold off any statin due to palliative care  Other Stroke Risk Factors Advanced age  Other Active Problems  CHF  Hospital day # 3  SHARON BIBY, MSN, RN, ANVP-BC, ANP-BC, Lawernce IonGNP-BC [facility] Pager: [phone] 03/10/2014 10:54 AM  I have personally examined this patient, reviewed notes, independently viewed imaging studies, participated in medical decision making and plan of care. I have made any  additions or clarifications directly to the above note. Agree with note above.  Discussed with multiple family members at the bedside. They seem comfortable with palliative care and their questions were answered. Patient transfered to inpatient palliative care service when bed available  Delia HeadyPramod Jordana Dugue, MD Medical Director [facility] Pager: [phone] 03/10/2014 2:52 PM   To contact Stroke Continuity provider, please refer to [email or web address]. After hours, contact General Neurology

## 2014-03-10 NOTE — Progress Notes (Signed)
Patient WU:JWJX:Valerie Browning      DOB: 05-08-1936      BJY:[id number]   [facility]all[facility] at [facility] Progress Note    Subjective: Minimally responsive this morning. Family at bedside.  Appears comfortable    Filed Vitals:   03/10/14 0559  BP:   Pulse: 105  Temp: 98.2 F (36.8 C)  Resp: 22   Physical exam: GEN: eyes open, non-responsive, appears comfortable CV: mild tachy, regular LUNGS: CTAB, comfortable respiratory pattern ABD: soft, ND EXT: strong pulse, warm    Assessment and plan:  78 yo female [facility] after being found down at home. Hemorrhagic CVA with cerebral edema and midline shift.   1. Code Status: DNR   2. Goals of Care: Full comfort. Symptoms controlled, vitals stable. Maintaining perfusion. Prognosis likely in days.  I have placed SW referral for residential hospice.  Suspect family will be [place] [place].   3. Symptom Management:  1. Anxiety/Agitation: ativan PRN 2. Pain/Dyspnea: did well on [place]. Continue morphine scheduled and PRN.  3. Terminal Secretions: scopolamine patch, atropine drops 4.  4. Psychosocial/Spiritual: Very spiritual woman and family. Lost her husband few years ago. [place].  Orvis BrillAaron J. Oneill Bais D.O. Palliative Medicine Team at [facility]  Pager: [phone] Team Phone: [phone]

## 2014-03-10 NOTE — Discharge Summary (Signed)
Stroke Discharge Summary  Patient ID: Valerie Browning    l   MRN: [id number]      DOB: July 24, 1935  Date of Admission: 03/07/2014 Date of Discharge: 03/10/2014  Attending Physician:  Delia HeadyPramod Garnet Overfield, MD, Stroke MD  Consulting Physician(s):   Treatment Team:  [facility], MD Palliative [facility]    DISCHARGE DIAGNOSIS:  Principal Problem:   CVA (cerebral infarction) - Left PCA and left MCA stroke with mass affect and 5 mm left to right midline shift from cerebral edema, left frontal hemorrhagic transformation due to new diagnosed afib with RVR. Patient with resultant global aphasia, lethargy, dysphagia, right hemiparesis.  Active Problems:   ICH (intracerebral hemorrhage)   Cerebral edema   Atrial fibrillation with RVR   Essential hypertension   Hyperlipidemia LDL goal <70  History reviewed. No pertinent past medical history. Past Surgical History  Procedure Laterality Date  . Abdominal hysterectomy        Medication List         atropine 1 % ophthalmic solution  Place 4 drops under the tongue every 4 (four) hours as needed (secretions).     scopolamine 1 MG/3DAYS  Commonly known as:  TRANSDERM-SCOP  Place 1 patch (1.5 mg total) onto the skin every 3 (three) days.        LABORATORY STUDIES CBC    Component Value Date/Time   WBC 9.7 03/08/2014 0259   RBC 3.90 03/08/2014 0259   HGB 11.5* 03/08/2014 0259   HCT 35.6* 03/08/2014 0259   PLT 198 03/08/2014 0259   MCV 91.3 03/08/2014 0259   MCH 29.5 03/08/2014 0259   MCHC 32.3 03/08/2014 0259   RDW 13.7 03/08/2014 0259   LYMPHSABS 1.5 03/07/2014 1046   MONOABS 0.4 03/07/2014 1046   EOSABS 0.0 03/07/2014 1046   BASOSABS 0.0 03/07/2014 1046   CMP    Component Value Date/Time   NA 137 03/08/2014 0259   K 4.5 03/08/2014 0259   CL 103 03/08/2014 0259   CO2 17* 03/08/2014 0259   GLUCOSE 119* 03/08/2014 0259   BUN 20 03/08/2014 0259   CREATININE 1.27* 03/08/2014 0259   CALCIUM 9.3 03/08/2014 0259   PROT 8.0 03/08/2014  0259   ALBUMIN 3.6 03/08/2014 0259   AST 19 03/08/2014 0259   ALT 9 03/08/2014 0259   ALKPHOS 69 03/08/2014 0259   BILITOT 0.7 03/08/2014 0259   GFRNONAA 40* 03/08/2014 0259   GFRAA 46* 03/08/2014 0259   COAGS Lab Results  Component Value Date   INR 1.21 03/08/2014   INR 1.12 03/07/2014   Lipid Panel    Component Value Date/Time   CHOL 207* 03/08/2014 0259   TRIG 77 03/08/2014 0259   HDL 49 03/08/2014 0259   CHOLHDL 4.2 03/08/2014 0259   VLDL 15 03/08/2014 0259   LDLCALC 143* 03/08/2014 0259   HgbA1C  Lab Results  Component Value Date   HGBA1C 5.8* 03/08/2014   Cardiac Panel (last 3 results) No results found for this basename: CKTOTAL, CKMB, TROPONINI, RELINDX,  in the last 72 hours Urinalysis    Component Value Date/Time   COLORURINE YELLOW 03/07/2014 1143   APPEARANCEUR CLEAR 03/07/2014 1143   LABSPEC 1.010 03/07/2014 1143   PHURINE 7.5 03/07/2014 1143   GLUCOSEU NEGATIVE 03/07/2014 1143   HGBUR TRACE* 03/07/2014 1143   BILIRUBINUR NEGATIVE 03/07/2014 1143   KETONESUR TRACE* 03/07/2014 1143   PROTEINUR NEGATIVE 03/07/2014 1143   UROBILINOGEN 0.2 03/07/2014 1143   NITRITE NEGATIVE 03/07/2014 1143   LEUKOCYTESUR NEGATIVE 03/07/2014  1143   Urine Drug Screen  No results found for this basename: labopia,  cocainscrnur,  labbenz,  amphetmu,  thcu,  labbarb    Alcohol Level No results found for this basename: eth     SIGNIFICANT DIAGNOSTIC STUDIES Ct Angio Head W/cm &/or Wo Cm  03/08/2014  Enlarging LEFT frontal intraparenchymal hematoma with increasing mass effect, 5 mm LEFT to RIGHT midline shift. No underlying vascular abnormality. Occlusion of the LEFT P3 segment, in a background of evolving large LEFT posterior cerebral artery territory infarct, with edema effacing the distal LEFT posterior cerebral artery branches.  Ct Angio Head W/cm &/or Wo Cm  03/08/2014  Very slightly beaded appearance of the cervical internal carotid arteries, could reflect spasm, less  likely fibromuscular dysplasia. 50% stenosis of the LEFT internal carotid artery due to calcific atherosclerosis.  Ct Head Wo Contrast  03/07/2014  1. Large 3.6 x 2.8 cm intraparenchymal hemorrhage left frontoparietal region with approximate midline shift of 4 mm from left to right . 2. Large left occipital lobe infarct.  Dg Chest Portable 1 View  03/08/2014 Findings suggesting mild vascular congestion.  03/07/2014 IMPRESSION: 1. Pulmonary vascular congestion noted.      HISTORY OF PRESENT ILLNESS Valerie Browning is an 78 y.o. female who was brought to [facility] 03/07/2014 after family found patient in AM not acting herself. Patient was seen last night 03/06/2014 around 2000 hours, and apparently she was awake and walking and talking and at baseline.. When they went to check on her this morning, she was sitting apparently at home. She had a bruise on her right forehead was unable to speak with them. He called 911. She was brought to the hospital. She presents here 14 hours after her last time known normal. CT head showed a right ICH, therefore was not administered IV tPA. [facility] and per report found to be in Afib with RVR. She was given Cardizem and rates have been in the 80's. patient was then transferred to [facility] . Currently she is non verbal, follows no commands and is moving left>right. BP 158/98.    HOSPITAL COURSE  Stroke: Left PCA and left MCA stroke with mass affect and 5 mm left to right midline shift from cerebral edema, left frontal hemorrhagic transformation - infarcts likely due to new diagnosed afib with RVR. Patient with resultant global aphasia, lethargy, dysphagia, right hemiparesis.  CT showed left PCA and MCA infarction with left frontal hemorrhagic transformation  HgbA1c 5.8  NPO  no antithrombotics prior to admission, now on no antithrombotics due to hemorrhage  Neurologic prognosis for recovery is poor  Palliative care consulted. Dr.  Greig Right talked with family. Patient made comfort care. SW assisting with discharge disposition.  Cerebral edema   CT showed increased midline shift   consistent with large PCA and MCA stroke with left frontal hemorrhage   discussed 3% saline with family and they declined   family optioned for palliative care   Afib with RVR and pulmonary edema   New onset  Started on Cardizem drip, Metoprolol and Lasix  Face mask  Hypertension  Home meds: none  Hyperlipidemia  Home meds: none  LDL 143   Other Stroke Risk Factors  Advanced age  Other Active Problems  CHF   DISCHARGE EXAM Blood pressure 121/62, pulse 105, temperature 98.2 F (36.8 C), temperature source Oral, resp. rate 22, height 5\' 3"  (1.6 m), weight 61 kg (134 lb 7.7 oz), SpO2 91.00%.  General -  Well nourished, well developed, on face mask.  Ophthalmologic - not able to cooperate on exam.  Cardiovascular - irregularly irregular heart rate and rhythm.  Lung - decreased breath sound on the left, clear on the right.  Neuro - sleepy, but open eyes on voice stimulation. Still global aphasia, PERRL, not blink to threat on the right, right facial droop, tongue in middle inside mouth, right UE trace withdraw to pain but right LE against gravity on pain stimulation. LUE and LLE move spontaneously. Reflex 1+, bilateral babinski  Discharge Diet   NPO   DISCHARGE PLAN  [facility]   Comfort care  30 minutes were spent preparing discharge.  Annie MainSHARON BIBY, MSN, RN, ANVP-BC, ANP-BC, GNP-BC [facility] Pager: [phone] 03/10/2014 2:56 PM  I have personally examined this patient, reviewed notes, independently viewed imaging studies, participated in medical decision making and plan of care. I have made any additions or clarifications directly to the above note. Agree with note above.   Delia HeadyPramod Waylan Busta, MD Medical Director [facility] Pager: [phone] 03/10/2014  4:14 PM

## 2014-03-10 NOTE — Clinical Social Work Note (Signed)
CSW consulted regarding residential hospice placement for patient at time of discharge. CSW spoke with pt's son, Valerie Browning [phone] regarding residential hospice placement. Per pt's son, family interested [place]. CSW provided emotional support to pt's son.  CSW has made appropriate referrals and will continue to follow once bed availabilities are offered.  Valerie Browning, LCSWA [phone]) Licensed Clinical Social Worker Orthopedics [phone](5N17-32) and Surgical [phone]([id number])

## 2014-03-10 NOTE — Clinical Social Work Note (Signed)
[facility]) to meet with pt's family at bedside on 03/10/2014 4pm to discuss possible admit to residential hospice facility. CSW has completed discharge packet (and placed on chart) in event pt is able to be admitted on 03/10/2014. [facility]([place]) admissions liaison to inform pt's RN of admission decision at the conclusion of family meeting. Discharge summary has been [facility]([place]). RN updated and aware of information.   *If pt able to admit to [facility]([place]), RN to call for EMS [facility])*  [facility] [phone]([place]) [phone] EMS [facility]) [phone]  Marcelline DeistEmily Equan Cogbill, LCSWA [phone]) Licensed Clinical Social Worker Orthopedics [phone](5N17-32) and Surgical [phone]([id number])

## 2014-03-10 NOTE — Progress Notes (Signed)
Nutrition Brief Note  Patient identified due to low Braden Score   Wt Readings from Last 15 Encounters:  03/07/14 134 lb 7.7 oz (61 kg)  10/02/13 134 lb 5 oz (60.924 kg)   78 yo female [facility] after being found down at home. Hemorrhagic CVA with cerebral edema and midline shift.   Current diet order is NPO. Per chart pt is nonverbal and is full comfort care. Labs and medications reviewed.   No nutrition interventions warranted at this time. If nutrition issues arise, please consult RD.   Ian Malkineanne Barnett RD, LDN Inpatient Clinical Dietitian Pager: [phone] After Hours Pager: [phone]

## 2014-03-11 MED ORDER — MORPHINE SULFATE (CONCENTRATE) 10 MG /0.5 ML PO SOLN: 5.0000 mg | ORAL | Status: DC | PRN

## 2014-03-11 MED ORDER — MORPHINE SULFATE (CONCENTRATE) 10 MG /0.5 ML PO SOLN
5.0000 mg | Freq: Four times a day (QID) | ORAL | Status: DC
  Administered 2014-03-11: 5 mg via ORAL
  Filled 2014-03-11: qty 0.5

## 2014-03-11 MED ORDER — LORAZEPAM 2 MG/ML PO CONC: 0.5000 mg | ORAL | Status: AC | PRN

## 2014-03-11 MED ORDER — LORAZEPAM 2 MG/ML PO CONC: 0.5000 mg | ORAL | Status: DC | PRN

## 2014-03-11 MED ORDER — MORPHINE SULFATE 10 MG/5ML PO SOLN: 5.0000 mg | Freq: Two times a day (BID) | ORAL | Status: AC | PRN

## 2014-03-11 MED ORDER — MORPHINE SULFATE 10 MG/5ML PO SOLN: 5.0000 mg | Freq: Four times a day (QID) | ORAL | Status: AC

## 2014-03-11 NOTE — Progress Notes (Signed)
Pt discharged home by [facility]. Pain med given prior to discharge.

## 2014-03-11 NOTE — Progress Notes (Signed)
Patient XB:JYNW:Valerie Browning      DOB: [birth year]      GNF:[id number]   Palliative Medicine Team at [facility] Progress Note    Subjective: Nonverbal. Eyes open and alert. Unable to provide history or have complex interactions    Filed Vitals:   03/11/14 0524  BP: 118/48  Pulse: 71  Temp: 98.6 F (37 C)  Resp: 18   Physical exam: GEN: alert, NAD HEENT: [place]< sclera anicteric, secretions better controlled.  CV: RRR LUNGS: CTAB ABD: soft, ND EXT: no edema Neuro: follows simple commands intermittently with left hand. Dense right hemiplegia.     Assessment and plan: 78 yo female [facility] after being found down at home. Hemorrhagic CVA with cerebral edema and midline shift.   1. Code Status: DNR   2. Goals of Care: Full comfort. Family has elected home hospice care. Plan for d.c today. Spoke with family today. They have multiple family members providing 24h care at home.  They are aware of how to handle things through hospice if needing help at home or needing to transfer to hospice facility.   3. Symptom Management:  1. Anxiety/Agitation: ativan PRN. Will change to oral concentrate as going home 2. Pain/Dyspnea: did well on [place]. Change morphine to oral concentrate 3. Terminal Secretions: scopolamine patch, atropine drops        4. Psychosocial/Spiritual: Very spiritual woman and family. Lost her husband few years ago. [place].    Total Time: 25 minutes       >50% of time spent in counseling and coordination of care regarding above assessment and plan  Orvis BrillAaron J. Antoine Vandermeulen D.O. Palliative Medicine Team at [facility]  Pager: [phone] Team Phone: [phone]

## 2014-03-11 NOTE — Progress Notes (Signed)
STROKE TEAM PROGRESS NOTE   HISTORY Valerie Browning is an 78 y.o. female who [facility] after family found patient in AM not acting herself. Patient was seen last night 03/06/2014 around 2000 hours, and apparently she was awake and walking and talking and at baseline.. When they went to check on her this morning, she was sitting apparently at home. She had a bruise on her right forehead was unable to speak with them. He called 911. She was brought to the hospital. She presents here 14 hours after her last time known normal. CT head showed a right ICH, therefore was not administered IV tPA. [facility] to [facility] and per report found to be in Afib with RVR. She was given Cardizem and rates have been in the 80's. patient was [facility]. Currently she is non verbal, follows no commands and is moving left>right. BP 158/98.    SUBJECTIVE (INTERVAL HISTORY) Multiple family members at the bedside. They are requesting to be  discharged to hospice home.   OBJECTIVE Temp:  [98.6 F (37 C)] 98.6 F (37 C) (10/20 0524) Pulse Rate:  [71] 71 (10/20 0524) Cardiac Rhythm:  [-]  Resp:  [18] 18 (10/20 0524) BP: (118)/(48) 118/48 mmHg (10/20 0524) SpO2:  [91 %] 91 % (10/20 0524)   PHYSICAL EXAM General - Well nourished, well developed, on face mask.  Ophthalmologic - not able to cooperate on exam.  Cardiovascular - irregularly irregular heart rate and rhythm.  Lung - decreased breath sound on the left, clear on the right.  Neuro - sleepy, but open eyes on voice stimulation. Still global aphasia, PERRL, not blink to threat on the right, right facial droop, tongue in middle inside mouth, right UE trace withdraw to pain but right LE against gravity on pain stimulation. LUE and LLE move spontaneously. Reflex 1+, bilateral babinski   ASSESSMENT/PLAN Ms. Valerie Browning is a 78 y.o. female with unclear PMH presenting with aphasia and right sided  hemiparesis. CT head showed left MCA and PCA stroke with left frontal hemorrhagic transformation. She was found to have afib in ICU. She did not receive IV t-PA due to hemorrhage.   Stroke:  Left PCA and left MCA stroke with mass affect and 5 mm left to right midline shift from cerebral edema, left frontal hemorrhagic transformation - infarcts likely due to new diagnosed afib with RVR. Patient with resultant global aphasia, lethargy, dysphagia, right hemiparesis.  CT showed left PCA and MCA infarction with left frontal hemorrhagic transformation  2D Echo cancelled due to comfort care orders placed.  LDL 143, not at goal < 70  HgbA1c 5.8  SCDs for VTE prophylaxis  NPO   Bedrest  no antithrombotics prior to admission, now on no antithrombotics due to hemorrhage  Neurologic prognosis for recovery is poor   Palliative care consulted. Dr. Greig RightLampkin talked with family. Patient made comfort care yesterday. SW assisting with discharge disposition today.   Cerebral edema - CT showed increased midline shift - consistent with large PCA and MCA stroke with left frontal hemorrhage - discussed 3% saline with family and they declined - family optioned for palliative care  Afib with RVR and pulmonary edema - hold off aggressive measure due to palliative care in place  Hypertension  Home meds:   none  Unstable  BP goal < 140  Family requested palliative care, will not initiate any aggressive measures.  Hyperlipidemia  Home meds:  none  LDL 143  Hold off any statin due to palliative care  Other Stroke Risk Factors Advanced age  Other Active Problems  CHF  Hospital day # 4  SHARON BIBY, MSN, RN, ANVP-BC, ANP-BC, Lawernce IonGNP-BC [facility] Pager: [phone] 03/11/2014 3:10 PM  I have personally examined this patient, reviewed notes, independently viewed imaging studies, participated in medical decision making and plan of care. I have made any additions or  clarifications directly to the above note. Agree with note above.  Discussed with multiple family members at the bedside. They seem comfortable with palliative care  At home and their questions were answered.patient to be discharged to home hospice today Delia HeadyPramod Sethi, MD Medical Director [facility] Pager: [phone] 03/11/2014 3:10 PM   To contact Stroke Continuity provider, please refer to [email or web address]. After hours, contact General Neurology

## 2014-03-11 NOTE — Clinical Social Work Note (Signed)
[facility] RN confirmed home address with family at bedside. CSW arranged for EMS ([facility]) pick-up. CSW signing off.  Marcelline Deistmily Harol Shabazz, LCSWA [phone]) Licensed Clinical Social Worker Orthopedics [phone](5N17-32) and Surgical [phone]([id number])

## 2014-03-11 NOTE — Care Management Note (Signed)
  Page 1 of 1   03/11/2014     9:57:18 AM CARE MANAGEMENT NOTE 03/11/2014  Patient:  Donivan ScullVANS,Reatha F   Account Number:  [id number]  Date Initiated:  03/11/2014  Documentation initiated by:  Ronny FlurryWILE,Ellias Mcelreath  Subjective/Objective Assessment:     Action/Plan:   Anticipated DC Date:  03/11/2014   Anticipated DC Plan:  HOME W HOSPICE CARE         Choice offered to / List presented to:  C[facility]4 Adult Children           [facility]   Status of service:  Completed, signed off Medicare Important Message given?  YES (If response is "NO", the following Medicare IM given date fields will be blank) Date Medicare IM given:  03/11/2014 Medicare IM given by:  Ronny FlurryWILE,Nadra Hritz Date Additional Medicare IM given:   Additional Medicare IM given by:    Discharge Disposition:    Per UR Regulation:    If discussed at Long Length of Stay Meetings, dates discussed:    Comments:  03-20-14 [facility] , she has all required information and will order equipment and call NCM back when she has an estimated time of delivery . Ronny FlurryHeather Mesha Schamberger RN BSN [phone]

## 2014-03-23 DEATH — deceased
